# Patient Record
Sex: Female | Born: 1990 | Race: Black or African American | Hispanic: No | Marital: Single | Smoking: Never smoker
Health system: Southern US, Community
[De-identification: ages and names within clinical notes are randomized; demographics above are authoritative.]

## PROBLEM LIST (undated history)

## (undated) DIAGNOSIS — L700 Acne vulgaris: Secondary | ICD-10-CM

## (undated) DIAGNOSIS — L309 Dermatitis, unspecified: Secondary | ICD-10-CM

## (undated) DIAGNOSIS — A749 Chlamydial infection, unspecified: Secondary | ICD-10-CM

## (undated) DIAGNOSIS — O24419 Gestational diabetes mellitus in pregnancy, unspecified control: Secondary | ICD-10-CM

## (undated) HISTORY — PX: NO PAST SURGERIES: SHX2092

## (undated) HISTORY — PX: OTHER SURGICAL HISTORY: SHX169

## (undated) HISTORY — DX: Dermatitis, unspecified: L30.9

## (undated) HISTORY — DX: Acne vulgaris: L70.0

## (undated) HISTORY — DX: Chlamydial infection, unspecified: A74.9

## (undated) HISTORY — DX: Gestational diabetes mellitus in pregnancy, unspecified control: O24.419

---

## 2015-10-08 ENCOUNTER — Telehealth: Payer: Self-pay | Admitting: Family Medicine

## 2015-10-08 NOTE — Telephone Encounter (Signed)
Pt is going to call back when she can make an apt.

## 2015-10-29 ENCOUNTER — Encounter (INDEPENDENT_AMBULATORY_CARE_PROVIDER_SITE_OTHER): Payer: Self-pay

## 2015-10-29 ENCOUNTER — Encounter: Payer: Self-pay | Admitting: Family Medicine

## 2015-10-29 ENCOUNTER — Ambulatory Visit (INDEPENDENT_AMBULATORY_CARE_PROVIDER_SITE_OTHER): Payer: BLUE CROSS/BLUE SHIELD | Admitting: Family Medicine

## 2015-10-29 VITALS — BP 106/62 | HR 76 | Resp 18 | Ht 64.5 in | Wt 131.0 lb

## 2015-10-29 DIAGNOSIS — Z7189 Other specified counseling: Secondary | ICD-10-CM | POA: Diagnosis not present

## 2015-10-29 DIAGNOSIS — L700 Acne vulgaris: Secondary | ICD-10-CM

## 2015-10-29 DIAGNOSIS — Z91048 Other nonmedicinal substance allergy status: Secondary | ICD-10-CM

## 2015-10-29 DIAGNOSIS — Z7689 Persons encountering health services in other specified circumstances: Secondary | ICD-10-CM

## 2015-10-29 DIAGNOSIS — Z9109 Other allergy status, other than to drugs and biological substances: Secondary | ICD-10-CM

## 2015-10-29 MED ORDER — CLINDAMYCIN PHOSPHATE 1 % EX GEL: Freq: Two times a day (BID) | CUTANEOUS | 3 refills | Status: DC

## 2015-10-29 NOTE — Progress Notes (Signed)
Chief Complaint  Patient presents with  . Establish Care    no previous pcp    Patient is a healthy 25 year old woman here to establish for care. She has not seen a physician since she left her pediatrician. She lives with her mother, and works full-time. She states that she eats a healthy diet and exercises regularly. She has no major medical problems. She is on no prescription medications. She has never had a complete physical examination and Pap smear. Patient is sexually active. She is advised to come back for this screening. She has a single partner, a boyfriend of 8 years. She's never had a sexually transmitted disease . She is using condoms for birth control successfully. She has seen a dermatologist for acne. She found Dapsone made her skin Worse. She would like a different prescription for acne. Review of systems she revealed that she does have some minor vaginal itching. No pelvic pain or fever. She is advised to try over-the-counter Monistat until she can come back for a physical and pelvic exam She refuses flu shot. This is recommended for her.  Patient Active Problem List   Diagnosis Date Noted  . Environmental allergies 10/29/2015  . Superficial acne vulgaris 10/29/2015   Outpatient Encounter Prescriptions as of 10/29/2015  Medication Sig  . cetirizine (ZYRTEC) 10 MG tablet Take 10 mg by mouth daily.  . clindamycin (CLINDAGEL) 1 % gel Apply topically 2 (two) times daily.   No facility-administered encounter medications on file as of 10/29/2015.    Past Surgical History:  Procedure Laterality Date  . none     Family History  Problem Relation Age of Onset  . Healthy Mother   . Healthy Father   . Hypertension Maternal Grandmother     Review of Systems  Constitutional: Negative for chills, fever and weight loss.  HENT: Negative for congestion and hearing loss.   Eyes: Negative for blurred vision and pain.  Respiratory: Negative for cough and shortness of breath.    Cardiovascular: Negative for chest pain and leg swelling.  Gastrointestinal: Negative for abdominal pain, constipation, diarrhea and heartburn.  Genitourinary: Negative for dysuria and frequency.       Slight vaginal discharge and itching  Musculoskeletal: Negative for falls, joint pain and myalgias.  Neurological: Negative for dizziness, seizures and headaches.  Psychiatric/Behavioral: Negative for depression. The patient is not nervous/anxious and does not have insomnia.    Physical Exam  Constitutional: She is oriented to person, place, and time. She appears well-developed and well-nourished.  HENT:  Head: Normocephalic and atraumatic.  Right Ear: External ear normal.  Mouth/Throat: Oropharynx is clear and moist.  Cerumen L  Eyes: Conjunctivae are normal. Pupils are equal, round, and reactive to light.  Neck: Normal range of motion. No thyromegaly present.  Cardiovascular: Normal rate and regular rhythm.   Pulmonary/Chest: Effort normal and breath sounds normal. She has no rales.  Abdominal: Soft. Bowel sounds are normal.  Musculoskeletal: Normal range of motion. She exhibits no edema.  Lymphadenopathy:    She has no cervical adenopathy.  Neurological: She is alert and oriented to person, place, and time. She has normal reflexes.  Skin: Skin is warm and dry.  Superficial acne  Psychiatric: She has a normal mood and affect. Her behavior is normal. Judgment and thought content normal.   1. Encounter to establish care with new doctor   2. Environmental allergies Prn Zyrtec  3. Superficial acne vulgaris Discussed cleaning and skin care.  Prescribed clindamycin.  Patient Instructions  Use the clindamycin gel twice a day for acne May use moisturizer in addition if drying occurs Call for problems  Get OTC monistat for the itching symptoms  I recommend you get a flu shot  I recommend you schedule an appointment for a full physical and PAP  See me yearly

## 2015-10-29 NOTE — Patient Instructions (Signed)
Use the clindamycin gel twice a day for acne May use moisturizer in addition if drying occurs Call for problems  Get OTC monistat for the itching symptoms  I recommend you get a flu shot  I recommend you schedule an appointment for a full physical and PAP  See me yearly

## 2015-12-11 ENCOUNTER — Other Ambulatory Visit (HOSPITAL_COMMUNITY)
Admission: RE | Admit: 2015-12-11 | Discharge: 2015-12-11 | Disposition: A | Discharge status: Home / Self Care | Payer: BLUE CROSS/BLUE SHIELD | Source: Ambulatory Visit | Attending: Family Medicine | Admitting: Family Medicine

## 2015-12-11 ENCOUNTER — Encounter: Payer: Self-pay | Admitting: Family Medicine

## 2015-12-11 ENCOUNTER — Ambulatory Visit (INDEPENDENT_AMBULATORY_CARE_PROVIDER_SITE_OTHER): Payer: BLUE CROSS/BLUE SHIELD | Admitting: Family Medicine

## 2015-12-11 ENCOUNTER — Ambulatory Visit (HOSPITAL_COMMUNITY): Admission: RE | Admit: 2015-12-11 | Payer: BLUE CROSS/BLUE SHIELD | Source: Ambulatory Visit

## 2015-12-11 VITALS — BP 104/80 | HR 69 | Ht 64.5 in | Wt 130.0 lb

## 2015-12-11 DIAGNOSIS — R1031 Right lower quadrant pain: Secondary | ICD-10-CM

## 2015-12-11 LAB — URINALYSIS, ROUTINE W REFLEX MICROSCOPIC
Bilirubin Urine: NEGATIVE
GLUCOSE, UA: NEGATIVE mg/dL
Ketones, ur: NEGATIVE mg/dL
Nitrite: NEGATIVE
PH: 6 (ref 5.0–8.0)
PROTEIN: NEGATIVE mg/dL
Specific Gravity, Urine: 1.02 (ref 1.005–1.030)

## 2015-12-11 LAB — CBC WITH DIFFERENTIAL/PLATELET
BASOS ABS: 0 10*3/uL (ref 0.0–0.1)
Basophils Relative: 0 %
EOS PCT: 6 %
Eosinophils Absolute: 0.2 10*3/uL (ref 0.0–0.7)
HEMATOCRIT: 38.2 % (ref 36.0–46.0)
Hemoglobin: 12.8 g/dL (ref 12.0–15.0)
LYMPHS ABS: 1.2 10*3/uL (ref 0.7–4.0)
LYMPHS PCT: 29 %
MCH: 28.2 pg (ref 26.0–34.0)
MCHC: 33.5 g/dL (ref 30.0–36.0)
MCV: 84.1 fL (ref 78.0–100.0)
MONO ABS: 0.3 10*3/uL (ref 0.1–1.0)
Monocytes Relative: 8 %
NEUTROS ABS: 2.4 10*3/uL (ref 1.7–7.7)
Neutrophils Relative %: 57 %
PLATELETS: 289 10*3/uL (ref 150–400)
RBC: 4.54 MIL/uL (ref 3.87–5.11)
RDW: 13.1 % (ref 11.5–15.5)
WBC: 4.2 10*3/uL (ref 4.0–10.5)

## 2015-12-11 LAB — URINE MICROSCOPIC-ADD ON

## 2015-12-11 NOTE — Progress Notes (Signed)
Chief Complaint  Patient presents with  . Back Pain    right mid back pain x saturday. Sharp pain. Eased up Monday   Here for an acute visit On Sat developed RLQ pain that steadily worsened through day On Sunday was immobilized with severe pain, decreased appetite and nausea, no vomiting, and chills.  Pain radiated from right lower abomen to right flank area.  No diarrhea or constipation.  Is just finishing menstrual period.  Feels certain sh eis not pregnant.  No urinary symptoms or frequency or hematuria. No history of ovarian cyst or problem No history of kidney stone or infection No abdominal surgeries On Monday the pain eased up enough to go to work.  Is taking ibuprofen.  Still no appetite.  No fever yesterday or today.      Patient Active Problem List   Diagnosis Date Noted  . Environmental allergies 10/29/2015  . Superficial acne vulgaris 10/29/2015    Outpatient Encounter Prescriptions as of 12/11/2015  Medication Sig  . cetirizine (ZYRTEC) 10 MG tablet Take 10 mg by mouth daily.  . clindamycin (CLINDAGEL) 1 % gel Apply topically 2 (two) times daily.   No facility-administered encounter medications on file as of 12/11/2015.     No Known Allergies  Review of Systems  Constitutional: Positive for activity change, appetite change and chills. Negative for fever.       Didn't take temp  HENT: Negative for congestion and sore throat.   Eyes: Positive for redness. Negative for visual disturbance.       Itching R eye today  Respiratory: Negative for cough and shortness of breath.   Cardiovascular: Negative for chest pain, palpitations and leg swelling.  Gastrointestinal: Positive for abdominal pain and nausea. Negative for abdominal distention, constipation, diarrhea and vomiting.  Genitourinary: Positive for flank pain. Negative for difficulty urinating, dysuria, frequency and menstrual problem.  Musculoskeletal: Negative for arthralgias and myalgias.  Neurological:  Negative.  Negative for dizziness and headaches.  Hematological: Negative for adenopathy. Does not bruise/bleed easily.  Psychiatric/Behavioral: Negative.  Negative for dysphoric mood. The patient is not nervous/anxious.     BP 104/80   Pulse 69   Ht 5' 4.5" (1.638 m)   Wt 130 lb (59 kg)   SpO2 100%   BMI 21.97 kg/m   Physical Exam  Constitutional: She is oriented to person, place, and time. She appears well-developed and well-nourished. No distress.  Appears comfortable  HENT:  Head: Normocephalic and atraumatic.  Nose: Nose normal.  Mouth/Throat: Oropharynx is clear and moist.  Cerumen canals  Eyes: EOM are normal. Pupils are equal, round, and reactive to light.  Right conjunctiva injected  Neck: Normal range of motion. No thyromegaly present.  Cardiovascular: Normal rate, regular rhythm and normal heart sounds.   Pulmonary/Chest: Effort normal and breath sounds normal. No respiratory distress.  Abdominal: Soft. Bowel sounds are normal. There is no hepatosplenomegaly. There is tenderness in the right lower quadrant. There is rebound and guarding.    Musculoskeletal: Normal range of motion. She exhibits no edema.  Mild-mod CVAT to percussion on R  Lymphadenopathy:    She has no cervical adenopathy.  Neurological: She is alert and oriented to person, place, and time.  Skin: Skin is warm and dry. No rash noted.  Psychiatric: She has a normal mood and affect. Her behavior is normal. Thought content normal.    ASSESSMENT/PLAN:  1. Acute abdominal pain in right lower quadrant   - CBC with Differential - Urinalysis,  Routine w reflex microscopic - CT Abdomen Pelvis Wo Contrast; Future   Patient Instructions  Labs and testing today Be sure we can reach you with result    Eustace MooreYvonne Sue Constantina Laseter, MD

## 2015-12-11 NOTE — Patient Instructions (Signed)
Labs and testing today Be sure we can reach you with result

## 2016-04-09 ENCOUNTER — Ambulatory Visit (INDEPENDENT_AMBULATORY_CARE_PROVIDER_SITE_OTHER): Payer: BLUE CROSS/BLUE SHIELD | Admitting: Family Medicine

## 2016-04-09 ENCOUNTER — Encounter: Payer: Self-pay | Admitting: Family Medicine

## 2016-04-09 VITALS — BP 104/60 | HR 64 | Temp 97.9°F | Resp 16 | Ht 64.5 in | Wt 136.1 lb

## 2016-04-09 DIAGNOSIS — L309 Dermatitis, unspecified: Secondary | ICD-10-CM | POA: Diagnosis not present

## 2016-04-09 DIAGNOSIS — L700 Acne vulgaris: Secondary | ICD-10-CM | POA: Diagnosis not present

## 2016-04-09 MED ORDER — HYDROCORTISONE VALERATE 0.2 % EX CREA: 1.0000 "application " | TOPICAL_CREAM | Freq: Two times a day (BID) | CUTANEOUS | 0 refills | Status: DC

## 2016-04-09 MED ORDER — MINOCYCLINE HCL 50 MG PO CAPS: 50.0000 mg | ORAL_CAPSULE | Freq: Two times a day (BID) | ORAL | 3 refills | Status: DC

## 2016-04-09 NOTE — Patient Instructions (Signed)
Use the cortisone cream twice a day  This if for the current face rash Take a antihistamine like benadryl at bedtime Let me know if rash does not improve by Monday  Try minocin for the general acne Give it a month to work Call if you need help with a referral

## 2016-04-09 NOTE — Progress Notes (Signed)
    Chief Complaint  Patient presents with  . Rash    face x 24 hours   Has rash on face for 2 d Itches Sudden onset No new food or med or supplement No new lotion or make up Only other thing she can think of is an insect bite on her right leg 2 d prior No shortness of breath or mouth lesions or trouble swallowing Never had similar rash prior  Patient Active Problem List   Diagnosis Date Noted  . Environmental allergies 10/29/2015  . Superficial acne vulgaris 10/29/2015    Outpatient Encounter Prescriptions as of 04/09/2016  Medication Sig  . cetirizine (ZYRTEC) 10 MG tablet Take 10 mg by mouth daily.  . hydrocortisone valerate cream (WESTCORT) 0.2 % Apply 1 application topically 2 (two) times daily.  . minocycline (MINOCIN) 50 MG capsule Take 1 capsule (50 mg total) by mouth 2 (two) times daily.  . [DISCONTINUED] clindamycin (CLINDAGEL) 1 % gel Apply topically 2 (two) times daily.   No facility-administered encounter medications on file as of 04/09/2016.     No Known Allergies  Review of Systems  Constitutional: Negative for activity change, appetite change and fever.  HENT: Negative.  Negative for congestion, trouble swallowing and voice change.   Eyes: Negative for redness and visual disturbance.  Respiratory: Negative for shortness of breath and wheezing.   Gastrointestinal: Negative for nausea and vomiting.  Skin: Positive for rash.  All other systems reviewed and are negative.   BP 104/60 (BP Location: Right Arm, Patient Position: Sitting, Cuff Size: Normal)   Pulse 64   Temp 97.9 F (36.6 C) (Temporal)   Resp 16   Ht 5' 4.5" (1.638 m)   Wt 136 lb 1.3 oz (61.7 kg)   LMP 04/05/2016 (Exact Date)   SpO2 100%   BMI 23.00 kg/m   Physical Exam  Constitutional: She is oriented to person, place, and time. She appears well-developed and well-nourished. No distress.  HENT:  Head: Normocephalic and atraumatic.  Right Ear: External ear normal.  Left Ear: External  ear normal.  Nose: Nose normal.  Mouth/Throat: Oropharynx is clear and moist.  Eyes: Conjunctivae are normal. Pupils are equal, round, and reactive to light.  Neck: Normal range of motion.  Cardiovascular: Normal rate, regular rhythm and normal heart sounds.   Pulmonary/Chest: Effort normal and breath sounds normal. She has no wheezes.  Musculoskeletal:  Left lateral lower leg with 1 cm hyperpigmented nodule, mobile  Lymphadenopathy:    She has no cervical adenopathy.  Neurological: She is alert and oriented to person, place, and time.  Skin:  Face with fine petechial macular rash, no vesicles or pustules, not palpable cheeks and forehead predominately  Psychiatric: She has a normal mood and affect. Her behavior is normal. Thought content normal.    ASSESSMENT/PLAN:  1. Dermatitis Possible allergic rection  2. Superficial acne vulgaris Ongoing.  Not happy with benzoyl peroxide, salicylic acid, dapsone or clindamycin topicals   Patient Instructions  Use the cortisone cream twice a day  This if for the current face rash Take a antihistamine like benadryl at bedtime Let me know if rash does not improve by Monday  Try minocin for the general acne Give it a month to work Call if you need help with a referral   Miranda MooreYvonne Sue Taylon Coole, MD

## 2016-09-07 ENCOUNTER — Other Ambulatory Visit: Payer: Self-pay | Admitting: Family Medicine

## 2017-01-13 ENCOUNTER — Other Ambulatory Visit: Payer: Self-pay

## 2017-01-13 ENCOUNTER — Ambulatory Visit (INDEPENDENT_AMBULATORY_CARE_PROVIDER_SITE_OTHER): Payer: 59 | Admitting: Family Medicine

## 2017-01-13 ENCOUNTER — Encounter: Payer: Self-pay | Admitting: Family Medicine

## 2017-01-13 VITALS — BP 114/60 | HR 104 | Temp 98.0°F | Resp 16 | Ht 64.0 in | Wt 134.0 lb

## 2017-01-13 DIAGNOSIS — J020 Streptococcal pharyngitis: Secondary | ICD-10-CM | POA: Diagnosis not present

## 2017-01-13 LAB — POCT RAPID STREP A (OFFICE): Rapid Strep A Screen: POSITIVE — AB

## 2017-01-13 MED ORDER — AMOXICILLIN 500 MG PO CAPS: 500.0000 mg | ORAL_CAPSULE | Freq: Two times a day (BID) | ORAL | 0 refills | Status: DC

## 2017-01-13 NOTE — Progress Notes (Signed)
Patient ID: Miranda Potts, female    DOB: 02/05/1991, 26 y.o.   MRN: [id number]  Chief Complaint  Patient presents with  . Sore Throat  . Generalized Body Aches    Allergies Patient has no known allergies.  Subjective:   Miranda Potts is a 26 y.o. female who [facility].  HPI Patient reports that she has had a sore throat for the past 1-2 days.  Reports that she has not been feeling well at all and has had chills, headache, and subjective fever.  Has not had a runny nose or cough.  Reports that her body is achy and sore.  Felt nauseated this morning and threw up.  Reports that her throat has been sore and has taken some Motrin for it.  Denies any shortness of breath or rashes.  No neck stiffness.  No ear pain.  He is not on any regular medications.  Has no known drug allergies.  Did not receive a flu shot.  Does not smoke cigarettes.  Reports that she left work today because she felt so bad.  Is sexually active with her boyfriend.  Reports that she finished her menses yesterday.  Reports she is urinating well.  Has not felt like eating much but is drinking fluids without difficulty.  Voice is normal.  Is able to swallow normal but slightly painful.    Past Medical History:  Diagnosis Date  . Superficial acne vulgaris     Past Surgical History:  Procedure Laterality Date  . none      Family History  Problem Relation Age of Onset  . Healthy Mother   . Healthy Father   . Hypertension Maternal Grandmother      Social History   Socioeconomic History  . Marital status: Single    Spouse name: Not on file  . Number of children: Not on file  . Years of education: Not on file  . Highest education level: Not on file  Social Needs  . Financial resource strain: Not on file  . Food insecurity - worry: Not on file  . Food insecurity - inability: Not on file  . Transportation needs - medical: Not on file  . Transportation needs -  non-medical: Not on file  Occupational History  . Not on file  Tobacco Use  . Smoking status: Never Smoker  . Smokeless tobacco: Never Used  Substance and Sexual Activity  . Alcohol use: Yes    Comment: socially only   . Drug use: No  . Sexual activity: Yes    Partners: Male    Birth control/protection: Condom  Other Topics Concern  . Not on file  Social History Narrative  . Not on file    Review of Systems  Constitutional: Positive for appetite change, chills, fatigue and fever. Negative for unexpected weight change.  HENT: Positive for sore throat. Negative for ear discharge, sinus pressure, sinus pain, trouble swallowing and voice change.   Respiratory: Negative for cough, shortness of breath, wheezing and stridor.   Cardiovascular: Negative for chest pain.  Gastrointestinal: Positive for nausea. Negative for abdominal pain and diarrhea.  Genitourinary: Negative for difficulty urinating, frequency, hematuria, menstrual problem, pelvic pain and urgency.     Objective:   BP 114/60 (BP Location: Left Arm, Patient Position: Sitting, Cuff Size: Normal)   Pulse (!) 104   Temp 98 F (36.7 C)   Resp 16   Ht 5\' 4"  (1.626 m)   Wt 134 lb (  60.8 kg)   SpO2 96%   BMI 23.00 kg/m   Physical Exam  Constitutional: She is oriented to person, place, and time. She appears well-developed and well-nourished.  HENT:  Head: Normocephalic and atraumatic.  Right Ear: Hearing normal.  Left Ear: Hearing normal.  Mouth/Throat: Uvula is midline and mucous membranes are normal. No oral lesions. No uvula swelling. Posterior oropharyngeal erythema present. No oropharyngeal exudate or posterior oropharyngeal edema. Tonsils are 1+ on the right. Tonsils are 1+ on the left. No tonsillar exudate.  Eyes: EOM are normal. Pupils are equal, round, and reactive to light.  Neck: Normal range of motion. Neck supple. No thyromegaly present.  Cardiovascular: Normal rate and regular rhythm.  Pulmonary/Chest:  Effort normal and breath sounds normal. No stridor. No respiratory distress. She has no wheezes. She has no rhonchi. She has no rales. She exhibits no tenderness.  Lymphadenopathy:    She has no cervical adenopathy.  Neurological: She is alert and oriented to person, place, and time.  Skin: Skin is warm and dry.  Psychiatric: She has a normal mood and affect. Her behavior is normal.  Vitals reviewed.  Ear canals obstructed bilaterally with cerumen.  Assessment and Plan   1. Strep pharyngitis Rapid strep test performed and positive. - POCT rapid strep A - amoxicillin (AMOXIL) 500 MG capsule; Take 1 capsule (500 mg total) 2 (two) times daily by mouth.  Dispense: 20 capsule; Refill: 0 Ibuprofen 200 mg over-the-counter as directed. Supportive care and fluids encouraged.  Patient was encouraged to follow-up with her primary care physician for her Pap, pelvic and breast exam and to discuss contraception.  She was also instructed to follow-up for her flu shot after resolution of strep pharyngitis.  Patient was told to call with any questions concerns or worrisome symptoms. She was given a note for work.   No Follow-up on file. Aliene Beamsachel Daneya Hartgrove, MD 01/13/2017

## 2017-04-02 ENCOUNTER — Encounter: Payer: Self-pay | Admitting: Family Medicine

## 2017-04-02 ENCOUNTER — Other Ambulatory Visit: Payer: Self-pay

## 2017-04-02 ENCOUNTER — Ambulatory Visit (INDEPENDENT_AMBULATORY_CARE_PROVIDER_SITE_OTHER): Payer: 59 | Admitting: Family Medicine

## 2017-04-02 VITALS — BP 110/70 | HR 62 | Temp 98.3°F | Resp 16 | Ht 64.0 in | Wt 134.2 lb

## 2017-04-02 DIAGNOSIS — L308 Other specified dermatitis: Secondary | ICD-10-CM | POA: Diagnosis not present

## 2017-04-02 DIAGNOSIS — H6123 Impacted cerumen, bilateral: Secondary | ICD-10-CM | POA: Diagnosis not present

## 2017-04-02 MED ORDER — TRIAMCINOLONE ACETONIDE 0.5 % EX OINT: 1.0000 "application " | TOPICAL_OINTMENT | Freq: Two times a day (BID) | CUTANEOUS | 1 refills | Status: DC

## 2017-04-02 NOTE — Patient Instructions (Signed)
Use the triamcinolone twice a day until rash clears Use plenty of lotion  Consider getting ear wax removal drops at the pharmacy ( [facility] makes one)

## 2017-04-02 NOTE — Progress Notes (Signed)
Chief Complaint  Patient presents with  . Numbness    numbe for about a week, then started peeling  . Cerumen Impaction   Here with 2 complaints today. First, an interesting story where her index and long fingers of the right hand, just the pads, were numb for several days.  She states she had decreased sensation, no prickly or tingling, no pain.  She had had no accident or injury, no change in her activity.  This was followed by peeling of these fingers, followed by both hands.  She is here today with a peeling rash on her hands wondering how to manage this.  No fever or chills.  No other illness.  No new medicines or supplements.  No soap or chemical exposure. She also has a history of excessive earwax.  When she last came to the physician she was told she had cerumen in both ears.  She is noticed a decreased hearing in 1 year.  She would like to have this flushed.  We discussed home management with earwax removal drops.   Patient Active Problem List   Diagnosis Date Noted  . Environmental allergies 10/29/2015  . Superficial acne vulgaris 10/29/2015    Outpatient Encounter Medications as of 04/02/2017  Medication Sig  . cetirizine (ZYRTEC) 10 MG tablet Take 10 mg by mouth daily.  Marland Kitchen. triamcinolone ointment (KENALOG) 0.5 % Apply 1 application topically 2 (two) times daily.   No facility-administered encounter medications on file as of 04/02/2017.     No Known Allergies  Review of Systems  Constitutional: Negative for activity change, appetite change and unexpected weight change.  HENT: Positive for hearing loss. Negative for congestion, dental problem, postnasal drip and rhinorrhea.   Eyes: Negative for redness and visual disturbance.  Respiratory: Negative for cough and shortness of breath.   Cardiovascular: Negative for chest pain, palpitations and leg swelling.  Gastrointestinal: Negative for abdominal pain, constipation and diarrhea.  Genitourinary: Negative for difficulty  urinating, frequency and menstrual problem.  Musculoskeletal: Negative for arthralgias and back pain.  Skin: Positive for rash.  Neurological: Negative for dizziness and headaches.  Psychiatric/Behavioral: Negative for dysphoric mood and sleep disturbance. The patient is not nervous/anxious.     BP 110/70 (BP Location: Left Arm, Patient Position: Sitting, Cuff Size: Normal)   Pulse 62   Temp 98.3 F (36.8 C) (Temporal)   Resp 16   Ht 5\' 4"  (1.626 m)   Wt 134 lb 4 oz (60.9 kg)   LMP 04/02/2017   BMI 23.04 kg/m   Physical Exam  Constitutional: She appears well-developed and well-nourished. No distress.  HENT:  Head: Normocephalic and atraumatic.  Nose: Nose normal.  Mouth/Throat: Oropharynx is clear and moist.  Both ear canals with soft brown cerumen, TMs occluded  Eyes: Conjunctivae are normal. Pupils are equal, round, and reactive to light.  Neck: Normal range of motion.  Cardiovascular: Normal rate, regular rhythm and normal heart sounds.  Pulmonary/Chest: Effort normal and breath sounds normal.  Lymphadenopathy:    She has no cervical adenopathy.  Skin:  Hands with superficial peeling on the palmar and lateral sides, none on the dorsum.  No open skin.  No fissuring.  No vesicles.  No erythema.  No tenderness.  Phalen's and Tinel's are negative.  Sensory exam is normal.    ASSESSMENT/PLAN:  1. Bilateral impacted cerumen - Ear Lavage  2. Other eczema     Patient Instructions  Use the triamcinolone twice a day until rash clears Use  plenty of lotion  Consider getting ear wax removal drops at the pharmacy ( [facility] makes one)   Eustace Moore, MD

## 2017-08-06 ENCOUNTER — Encounter: Payer: Self-pay | Admitting: Family Medicine

## 2017-11-26 ENCOUNTER — Emergency Department (HOSPITAL_COMMUNITY)
Admission: EM | Admit: 2017-11-26 | Discharge: 2017-11-26 | Disposition: A | Discharge status: Home / Self Care | Payer: 59 | Attending: Emergency Medicine | Admitting: Emergency Medicine

## 2017-11-26 ENCOUNTER — Other Ambulatory Visit: Payer: Self-pay

## 2017-11-26 ENCOUNTER — Emergency Department (HOSPITAL_COMMUNITY): Payer: 59

## 2017-11-26 ENCOUNTER — Encounter (HOSPITAL_COMMUNITY): Payer: Self-pay | Admitting: Emergency Medicine

## 2017-11-26 DIAGNOSIS — W230XXA Caught, crushed, jammed, or pinched between moving objects, initial encounter: Secondary | ICD-10-CM | POA: Insufficient documentation

## 2017-11-26 DIAGNOSIS — Z79899 Other long term (current) drug therapy: Secondary | ICD-10-CM | POA: Insufficient documentation

## 2017-11-26 DIAGNOSIS — S5701XA Crushing injury of right elbow, initial encounter: Secondary | ICD-10-CM | POA: Diagnosis present

## 2017-11-26 DIAGNOSIS — Y939 Activity, unspecified: Secondary | ICD-10-CM | POA: Insufficient documentation

## 2017-11-26 DIAGNOSIS — Y929 Unspecified place or not applicable: Secondary | ICD-10-CM | POA: Diagnosis not present

## 2017-11-26 DIAGNOSIS — S5001XA Contusion of right elbow, initial encounter: Secondary | ICD-10-CM | POA: Diagnosis not present

## 2017-11-26 DIAGNOSIS — Y999 Unspecified external cause status: Secondary | ICD-10-CM | POA: Insufficient documentation

## 2017-11-26 MED ORDER — HYDROCODONE-ACETAMINOPHEN 5-325 MG PO TABS
1.0000 | ORAL_TABLET | Freq: Once | ORAL | Status: AC
  Administered 2017-11-26: 1 via ORAL
  Filled 2017-11-26: qty 1

## 2017-11-26 MED ORDER — ONDANSETRON HCL 4 MG PO TABS
4.0000 mg | ORAL_TABLET | Freq: Once | ORAL | Status: AC
  Administered 2017-11-26: 4 mg via ORAL
  Filled 2017-11-26: qty 1

## 2017-11-26 MED ORDER — IBUPROFEN 600 MG PO TABS: 600.0000 mg | ORAL_TABLET | Freq: Four times a day (QID) | ORAL | 0 refills | Status: DC

## 2017-11-26 MED ORDER — IBUPROFEN 400 MG PO TABS
400.0000 mg | ORAL_TABLET | Freq: Once | ORAL | Status: AC
  Administered 2017-11-26: 400 mg via ORAL
  Filled 2017-11-26: qty 1

## 2017-11-26 NOTE — ED Triage Notes (Signed)
Tripped and fell on rt arm last night.  Pain to elbow area

## 2017-11-26 NOTE — ED Provider Notes (Signed)
[facility] EMERGENCY DEPARTMENT Provider Note   CSN: [id number] Arrival date & time: 11/26/17  1729     History   Chief Complaint Chief Complaint  Patient presents with  . Arm Injury    HPI [place] Charbonnet is a 27 y.o. female.  Patient is a 27 year old female who presents to the emergency department with a complaint of right arm pain.  The patient states that on last evening a air conditioner fell on her right elbow area.  She denies any shoulder problem, or wrist problem.  Patient has pain of the right elbow and a portion of the right forearm.  There is pain with attempting to use straighten the elbow and with certain movements of the elbow.  The patient denies being on any anticoagulation medications.  There is been no recent operations or procedures involving the elbow.  There are no other injuries reported at this time.  The history is provided by the patient.  Arm Injury      Past Medical History:  Diagnosis Date  . Superficial acne vulgaris     Patient Active Problem List   Diagnosis Date Noted  . Environmental allergies 10/29/2015  . Superficial acne vulgaris 10/29/2015    Past Surgical History:  Procedure Laterality Date  . none       OB History   None      Home Medications    Prior to Admission medications   Medication Sig Start Date End Date Taking? Authorizing Provider  cetirizine (ZYRTEC) 10 MG tablet Take 10 mg by mouth daily.    [provider]  triamcinolone ointment (KENALOG) 0.5 % Apply 1 application topically 2 (two) times daily. 04/02/17   Eustace Moore, MD    Family History Family History  Problem Relation Age of Onset  . Healthy Mother   . Healthy Father   . Hypertension Maternal Grandmother     Social History Social History   Tobacco Use  . Smoking status: Never Smoker  . Smokeless tobacco: Never Used  Substance Use Topics  . Alcohol use: Yes    Comment: socially only   . Drug use: No     Allergies     Patient has no known allergies.   Review of Systems Review of Systems  Constitutional: Negative for activity change.       All ROS Neg except as noted in HPI  HENT: Negative for nosebleeds.   Eyes: Negative for photophobia and discharge.  Respiratory: Negative for cough, shortness of breath and wheezing.   Cardiovascular: Negative for chest pain and palpitations.  Gastrointestinal: Negative for abdominal pain and blood in stool.  Genitourinary: Negative for dysuria, frequency and hematuria.  Musculoskeletal: Positive for arthralgias. Negative for back pain and neck pain.  Skin: Negative.   Neurological: Negative for dizziness, seizures and speech difficulty.  Psychiatric/Behavioral: Negative for confusion and hallucinations.     Physical Exam Updated Vital Signs BP 112/74 (BP Location: Left Arm)   Pulse 76   Temp 98.3 F (36.8 C) (Oral)   Resp 18   Ht 5\' 4"  (1.626 m)   Wt 59 kg   LMP 09/30/2017 Comment: irregular periods   SpO2 99%   BMI 22.31 kg/m   Physical Exam  Constitutional: She is oriented to person, place, and time. She appears well-developed and well-nourished.  Non-toxic appearance.  HENT:  Head: Normocephalic.  Right Ear: Tympanic membrane and external ear normal.  Left Ear: Tympanic membrane and external ear normal.  Eyes: Pupils are  equal, round, and reactive to light. EOM and lids are normal.  Neck: Normal range of motion. Neck supple. Carotid bruit is not present.  Cardiovascular: Normal rate, regular rhythm, normal heart sounds, intact distal pulses and normal pulses.  Pulmonary/Chest: Breath sounds normal. No respiratory distress.  Abdominal: Soft. Bowel sounds are normal. There is no tenderness. There is no guarding.  Musculoskeletal: Normal range of motion.  There is full range of motion noted of the right shoulder.  There is no deformity of the bicep tricep area or the humerus.  There is pain olecranon process.  There is pain of the medial and  lateral epicondyles. Radial pulses 2+ on the right.  The capillary refill is less than 2 seconds.  Lymphadenopathy:       Head (right side): No submandibular adenopathy present.       Head (left side): No submandibular adenopathy present.    She has no cervical adenopathy.  Neurological: She is alert and oriented to person, place, and time. She has normal strength. No cranial nerve deficit or sensory deficit.  Skin: Skin is warm and dry.  Psychiatric: She has a normal mood and affect. Her speech is normal.  Nursing note and vitals reviewed.    ED Treatments / Results  Labs (all labs ordered are listed, but only abnormal results are displayed) Labs Reviewed - No data to display  EKG None  Radiology No results found.  Procedures Procedures (including critical care time)  Medications Ordered in ED Medications - No data to display   Initial Impression / Assessment and Plan / ED Course  I have reviewed the triage vital signs and the nursing notes.  Pertinent labs & imaging results that were available during my care of the patient were reviewed by me and considered in my medical decision making (see chart for details).       Final Clinical Impressions(s) / ED Diagnoses MDM  Vital signs reviewed.  Pulse oximetry is 99% on room air.  Within normal limits by my interpretation.  No gross neurovascular deficit noted of the right upper extremity.  There is pain of the olecranon and the lateral epicondyle area.  X-ray pending.  Patient states she continues to have pain.  Patient treated in the emergency department with ibuprofen and Norco.  Pain improving after medications in the emergency department.  X-ray is negative for fracture, dislocation, or effusion.  I discussed the findings of the examination as well as the findings on the x-ray with the patient in terms of which she understands.  The patient will use the sling over the next 3 or 4 days.  The patient will use  ibuprofen with breakfast, lunch, dinner, and at bedtime.  Patient to follow-up with Dr. Romeo Apple for additional orthopedic evaluation if not improving.   Final diagnoses:  Contusion of right elbow, initial encounter    ED Discharge Orders         Ordered    ibuprofen (ADVIL,MOTRIN) 600 MG tablet  4 times daily     11/26/17 1938           Ivery Quale, PA-C 11/26/17 1948    Jacalyn Lefevre, MD 11/26/17 2237

## 2017-11-26 NOTE — Discharge Instructions (Signed)
Your vital signs are within normal limits.  Your oxygen level is 99% on room air.  Within normal limits by my interpretation.  There are no neurological vascular deficits appreciated of your right upper extremity.  The x-ray of your elbow area is negative for fracture, dislocation, effusion/fluid in the joint.  Please use ibuprofen with breakfast, lunch, dinner, and at bedtime.  Please see Dr. Romeo Apple for additional evaluation patient if not improving.

## 2017-12-21 ENCOUNTER — Other Ambulatory Visit: Payer: Self-pay | Admitting: Family Medicine

## 2018-01-31 ENCOUNTER — Emergency Department (HOSPITAL_COMMUNITY): Payer: 59

## 2018-01-31 ENCOUNTER — Other Ambulatory Visit: Payer: Self-pay

## 2018-01-31 ENCOUNTER — Encounter (HOSPITAL_COMMUNITY): Payer: Self-pay | Admitting: *Deleted

## 2018-01-31 ENCOUNTER — Emergency Department (HOSPITAL_COMMUNITY)
Admission: EM | Admit: 2018-01-31 | Discharge: 2018-01-31 | Disposition: A | Discharge status: Home / Self Care | Payer: 59 | Attending: Emergency Medicine | Admitting: Emergency Medicine

## 2018-01-31 DIAGNOSIS — S61412A Laceration without foreign body of left hand, initial encounter: Secondary | ICD-10-CM | POA: Insufficient documentation

## 2018-01-31 DIAGNOSIS — W25XXXA Contact with sharp glass, initial encounter: Secondary | ICD-10-CM | POA: Diagnosis not present

## 2018-01-31 DIAGNOSIS — Y9302 Activity, running: Secondary | ICD-10-CM | POA: Insufficient documentation

## 2018-01-31 DIAGNOSIS — S71111A Laceration without foreign body, right thigh, initial encounter: Secondary | ICD-10-CM

## 2018-01-31 DIAGNOSIS — S61511A Laceration without foreign body of right wrist, initial encounter: Secondary | ICD-10-CM | POA: Diagnosis not present

## 2018-01-31 DIAGNOSIS — Y999 Unspecified external cause status: Secondary | ICD-10-CM | POA: Insufficient documentation

## 2018-01-31 DIAGNOSIS — Z23 Encounter for immunization: Secondary | ICD-10-CM | POA: Insufficient documentation

## 2018-01-31 DIAGNOSIS — Y929 Unspecified place or not applicable: Secondary | ICD-10-CM | POA: Diagnosis not present

## 2018-01-31 MED ORDER — NAPROXEN 500 MG PO TABS: 500.0000 mg | ORAL_TABLET | Freq: Two times a day (BID) | ORAL | 0 refills | Status: DC

## 2018-01-31 MED ORDER — TETANUS-DIPHTH-ACELL PERTUSSIS 5-2.5-18.5 LF-MCG/0.5 IM SUSP
0.5000 mL | Freq: Once | INTRAMUSCULAR | Status: AC
  Administered 2018-01-31: 0.5 mL via INTRAMUSCULAR
  Filled 2018-01-31: qty 0.5

## 2018-01-31 MED ORDER — LIDOCAINE-EPINEPHRINE (PF) 2 %-1:200000 IJ SOLN
10.0000 mL | Freq: Once | INTRAMUSCULAR | Status: AC
  Administered 2018-01-31: 10 mL via INTRADERMAL
  Filled 2018-01-31: qty 10

## 2018-01-31 MED ORDER — BACITRACIN ZINC 500 UNIT/GM EX OINT
1.0000 "application " | TOPICAL_OINTMENT | Freq: Two times a day (BID) | CUTANEOUS | Status: DC
  Administered 2018-01-31: 1 via TOPICAL
  Filled 2018-01-31: qty 0.9

## 2018-01-31 NOTE — ED Notes (Signed)
ED Provider at bedside. 

## 2018-01-31 NOTE — ED Triage Notes (Signed)
Patient ran through a glass door running from a dog.  Patient called 911 was treated, refused transport. Patient arrived to ED by private car.  Patient stated dizziness.  Bleeding controlled by wrapped gauze to bilateral hands and right thigh.

## 2018-01-31 NOTE — Discharge Instructions (Signed)

## 2018-01-31 NOTE — ED Provider Notes (Signed)
[facility] EMERGENCY DEPARTMENT Provider Note   CSN: [id number] Arrival date & time: 01/31/18  1750     History   Chief Complaint Chief Complaint  Patient presents with  . Laceration    HPI Miranda Potts is a 27 y.o. female.  HPI  The patient is a 27 year old female, she has no chronic medical conditions, she presents to the hospital after being injured when she ran through a glass door trying to escape a pimple that was chasing after her.  She has several lacerations involving her right dorsal wrist, right inner distal thigh and her left palm.  She denies any other injuries including head chest back neck face left lower extremity.  This was acute in onset, occurred 2 hours ago, she has had her childhood vaccinations but is unsure if she is up-to-date on tetanus.  Past Medical History:  Diagnosis Date  . Superficial acne vulgaris     Patient Active Problem List   Diagnosis Date Noted  . Environmental allergies 10/29/2015  . Superficial acne vulgaris 10/29/2015    Past Surgical History:  Procedure Laterality Date  . none       OB History   None      Home Medications    Prior to Admission medications   Medication Sig Start Date End Date Taking? Authorizing Provider  cetirizine (ZYRTEC) 10 MG tablet Take 10 mg by mouth daily.    [provider]  ibuprofen (ADVIL,MOTRIN) 600 MG tablet Take 1 tablet (600 mg total) by mouth 4 (four) times daily. 11/26/17   Ivery Quale, PA-C  naproxen (NAPROSYN) 500 MG tablet Take 1 tablet (500 mg total) by mouth 2 (two) times daily with a meal. 01/31/18   Eber Hong, MD  triamcinolone ointment (KENALOG) 0.5 % Apply 1 application topically 2 (two) times daily. 04/02/17   Eustace Moore, MD    Family History Family History  Problem Relation Age of Onset  . Healthy Mother   . Healthy Father   . Hypertension Maternal Grandmother     Social History Social History   Tobacco Use  . Smoking status: Never Smoker   . Smokeless tobacco: Never Used  Substance Use Topics  . Alcohol use: Yes    Comment: socially only   . Drug use: No     Allergies   Patient has no known allergies.   Review of Systems Review of Systems  Constitutional: Negative for fever.  Skin: Positive for wound.  Neurological: Negative for numbness.     Physical Exam Updated Vital Signs BP 103/62 (BP Location: Right Arm)   Pulse 68   Temp 98.2 F (36.8 C) (Oral)   Ht 1.626 m (5\' 4" )   Wt 59 kg   LMP 01/26/2018   SpO2 100%   BMI 22.31 kg/m   Physical Exam  Constitutional: She appears well-developed and well-nourished. No distress.  HENT:  Head: Normocephalic.  Eyes: Conjunctivae are normal. No scleral icterus.  Cardiovascular: Normal rate and regular rhythm.  Pulmonary/Chest: Effort normal and breath sounds normal.  Musculoskeletal: Normal range of motion. She exhibits tenderness ( ttp over the laceration site). She exhibits no edema.  Laceration noted to the dorsal right wrist, distal medial right thigh and left palm.  Otherwise all joints are very supple, compartments are very soft, no active bleeding, no foreign body seen on examination  Neurological: She is alert. Coordination normal.  Sensation and motor intact  Skin: Skin is warm and dry. She is not diaphoretic.  Lacerations  as noted above     ED Treatments / Results  Labs (all labs ordered are listed, but only abnormal results are displayed) Labs Reviewed - No data to display  EKG None  Radiology Dg Wrist Complete Right  Result Date: 01/31/2018 CLINICAL DATA:  jumped into glass door. Laceration to posterior right wrist. EXAM: RIGHT WRIST - COMPLETE 3+ VIEW COMPARISON:  None. FINDINGS: Posterior soft tissue laceration over the right wrist. No radiopaque foreign bodies. No acute bony abnormality. Specifically, no fracture, subluxation, or dislocation. IMPRESSION: No acute bony abnormality. Electronically Signed   By: Charlett Nose M.D.   On:  01/31/2018 21:28   Dg Femur Min 2 Views Right  Result Date: 01/31/2018 CLINICAL DATA:  Jumped into glass door.  Laceration. EXAM: RIGHT FEMUR 2 VIEWS COMPARISON:  None. FINDINGS: No acute bony abnormality. Specifically, no fracture, subluxation, or dislocation. No radiopaque foreign bodies within the soft tissues. IMPRESSION: No fracture or radiopaque foreign body. Electronically Signed   By: Charlett Nose M.D.   On: 01/31/2018 21:28    Procedures .Marland KitchenLaceration Repair Date/Time: 01/31/2018 10:38 PM Performed by: Eber Hong, MD Authorized by: Eber Hong, MD   Consent:    Consent obtained:  Verbal   Consent given by:  Patient   Risks discussed:  Infection, pain, need for additional repair, poor cosmetic result and poor wound healing   Alternatives discussed:  No treatment and delayed treatment Anesthesia (see MAR for exact dosages):    Anesthesia method:  Local infiltration   Local anesthetic:  Lidocaine 1% WITH epi Laceration details:    Location: Right Wrist - dorsal.   Length (cm):  2   Depth (mm):  2 Repair type:    Repair type:  Simple Pre-procedure details:    Preparation:  Patient was prepped and draped in usual sterile fashion and imaging obtained to evaluate for foreign bodies Exploration:    Hemostasis achieved with:  Direct pressure   Wound exploration: wound explored through full range of motion and entire depth of wound probed and visualized     Wound extent: no fascia violation noted, no foreign bodies/material noted, no muscle damage noted, no nerve damage noted, no tendon damage noted, no underlying fracture noted and no vascular damage noted   Treatment:    Area cleansed with:  Betadine   Amount of cleaning:  Standard   Irrigation solution:  Sterile saline   Irrigation volume:  500   Irrigation method:  Syringe Skin repair:    Repair method:  Sutures   Suture size:  5-0   Suture material:  Prolene   Suture technique:  Simple interrupted   Number of  sutures:  3 Approximation:    Approximation:  Close Post-procedure details:    Dressing:  Antibiotic ointment and sterile dressing   Patient tolerance of procedure:  Tolerated well, no immediate complications Comments:        Marland KitchenMarland KitchenLaceration Repair Date/Time: 01/31/2018 10:39 PM Performed by: Eber Hong, MD Authorized by: Eber Hong, MD   Consent:    Consent obtained:  Verbal   Consent given by:  Patient   Risks discussed:  Infection, pain, need for additional repair, poor cosmetic result and poor wound healing   Alternatives discussed:  No treatment and delayed treatment Anesthesia (see MAR for exact dosages):    Anesthesia method:  Local infiltration   Local anesthetic:  Lidocaine 1% WITH epi Laceration details:    Location:  Leg   Leg location:  R upper leg   Length (  cm):  4.5   Depth (mm):  3 Repair type:    Repair type:  Simple Pre-procedure details:    Preparation:  Patient was prepped and draped in usual sterile fashion and imaging obtained to evaluate for foreign bodies Exploration:    Hemostasis achieved with:  Direct pressure   Wound exploration: wound explored through full range of motion and entire depth of wound probed and visualized     Wound extent: no fascia violation noted, no foreign bodies/material noted, no muscle damage noted, no nerve damage noted, no tendon damage noted, no underlying fracture noted and no vascular damage noted     Contaminated: no   Treatment:    Area cleansed with:  Betadine   Amount of cleaning:  Standard   Irrigation solution:  Sterile saline   Irrigation volume:  500   Irrigation method:  Syringe Skin repair:    Repair method:  Sutures   Suture size:  4-0   Suture material:  Prolene   Suture technique:  Simple interrupted and horizontal mattress   Number of sutures:  6 (1 horizontal mattress to anchor for tension, 5 simple interrupted) Approximation:    Approximation:  Close Post-procedure details:    Dressing:   Antibiotic ointment and sterile dressing   Patient tolerance of procedure:  Tolerated well, no immediate complications Comments:         (including critical care time)  Medications Ordered in ED Medications  bacitracin ointment 1 application (1 application Topical Given 01/31/18 2047)  Tdap (BOOSTRIX) injection 0.5 mL (0.5 mLs Intramuscular Given 01/31/18 2035)  lidocaine-EPINEPHrine (XYLOCAINE W/EPI) 2 %-1:200000 (PF) injection 10 mL (10 mLs Intradermal Given 01/31/18 2045)     Initial Impression / Assessment and Plan / ED Course  I have reviewed the triage vital signs and the nursing notes.  Pertinent labs & imaging results that were available during my care of the patient were reviewed by me and considered in my medical decision making (see chart for details).     The patient had dressings placed on arrival.  She will need imaging to rule out foreign bodies of glass in the wounds but otherwise seems to have very straightforward linear lacerations.  There is no active bleeding, these lacerations or not near blood vessels and she has full extensor and flexor function of her bilateral hands.  The palm on the left has a very small superficial laceration which does not need primary repair.  X-rays negative for foreign bodies, irrigated and explored wounds extensively, no deep tissue damage, extensor mechanism totally intact to the right hand.  There is a very small wound to the left palm, this is not can require stitches, I recommended topical anabiotic's, she had extensive wound cleaning including her palm in the emergency department, stable for discharge on Naprosyn, no antibiotics indicated  Final Clinical Impressions(s) / ED Diagnoses   Final diagnoses:  Laceration of right wrist, initial encounter  Laceration of right thigh, initial encounter    ED Discharge Orders         Ordered    naproxen (NAPROSYN) 500 MG tablet  2 times daily with meals     01/31/18 2236             Eber HongMiller, Maxx Calaway, MD 01/31/18 2240

## 2018-02-10 ENCOUNTER — Emergency Department (HOSPITAL_COMMUNITY)
Admission: EM | Admit: 2018-02-10 | Discharge: 2018-02-10 | Disposition: A | Discharge status: Home / Self Care | Payer: 59 | Attending: Emergency Medicine | Admitting: Emergency Medicine

## 2018-02-10 ENCOUNTER — Other Ambulatory Visit: Payer: Self-pay

## 2018-02-10 ENCOUNTER — Encounter (HOSPITAL_COMMUNITY): Payer: Self-pay | Admitting: Emergency Medicine

## 2018-02-10 DIAGNOSIS — Z4802 Encounter for removal of sutures: Secondary | ICD-10-CM | POA: Diagnosis not present

## 2018-02-10 NOTE — ED Triage Notes (Signed)
Sutures placed last Monday.  Sutures to right wrist and right upper leg.  Denies any pain at this time.

## 2018-02-10 NOTE — ED Provider Notes (Signed)
[facility] EMERGENCY DEPARTMENT Provider Note   CSN: [id number] Arrival date & time: 02/10/18  1624     History   Chief Complaint Chief Complaint  Patient presents with  . Suture / Staple Removal    HPI Miranda Potts is a 27 y.o. female presenting for suture removal of her right wrist and her right thigh, sutures placed 10 days ago here. Injury occurred with accidental broken glass.  She reports no complications from the wounds which appear to be healing well.   HPI  Past Medical History:  Diagnosis Date  . Superficial acne vulgaris     Patient Active Problem List   Diagnosis Date Noted  . Environmental allergies 10/29/2015  . Superficial acne vulgaris 10/29/2015    Past Surgical History:  Procedure Laterality Date  . none       OB History   No obstetric history on file.      Home Medications    Prior to Admission medications   Medication Sig Start Date End Date Taking? Authorizing Provider  cetirizine (ZYRTEC) 10 MG tablet Take 10 mg by mouth daily.    [provider]  ibuprofen (ADVIL,MOTRIN) 600 MG tablet Take 1 tablet (600 mg total) by mouth 4 (four) times daily. 11/26/17   Ivery QualeBryant, [place], PA-C  naproxen (NAPROSYN) 500 MG tablet Take 1 tablet (500 mg total) by mouth 2 (two) times daily with a meal. 01/31/18   Eber HongMiller, Brian, MD  triamcinolone ointment (KENALOG) 0.5 % Apply 1 application topically 2 (two) times daily. 04/02/17   Eustace MooreNelson, Yvonne Sue, MD    Family History Family History  Problem Relation Age of Onset  . Healthy Mother   . Healthy Father   . Hypertension Maternal Grandmother     Social History Social History   Tobacco Use  . Smoking status: Never Smoker  . Smokeless tobacco: Never Used  Substance Use Topics  . Alcohol use: Yes    Comment: socially only   . Drug use: No     Allergies   Patient has no known allergies.   Review of Systems Review of Systems  Constitutional: Negative for chills and fever.  Skin:  Positive for wound. Negative for color change.  Neurological: Negative for numbness.     Physical Exam Updated Vital Signs BP 123/81 (BP Location: Right Arm)   Pulse 61   Temp (!) 97.4 F (36.3 C) (Oral)   Resp 16   Ht 5\' 4"  (1.626 m)   Wt 59 kg   LMP 01/26/2018   SpO2 99%   BMI 22.31 kg/m   Physical Exam Constitutional:      Appearance: She is well-developed.  HENT:     Head: Normocephalic.  Cardiovascular:     Rate and Rhythm: Normal rate.  Pulmonary:     Effort: Pulmonary effort is normal.  Musculoskeletal:        General: No tenderness.  Skin:    Findings: Laceration present.     Comments: Well healed lacerations right dorsal wrist and medial thigh.  Neurological:     Mental Status: She is alert and oriented to person, place, and time.     Sensory: No sensory deficit.      ED Treatments / Results  Labs (all labs ordered are listed, but only abnormal results are displayed) Labs Reviewed - No data to display  EKG None  Radiology No results found.  Procedures Procedures (including critical care time)  SUTURE REMOVAL Performed by: Burgess AmorJulie Oneka Parada  Consent: Verbal consent  obtained. Patient identity confirmed: provided demographic data Time out: Immediately prior to procedure a "time out" was called to verify the correct patient, procedure, equipment, support staff and site/side marked as required.  Location details: right wrist  Wound Appearance: clean  Sutures/Staples Removed: #3  Facility: sutures placed in this facility Patient tolerance: Patient tolerated the procedure well with no immediate complications.  SUTURE REMOVAL Performed by: Burgess Amor  Consent: Verbal consent obtained. Patient identity confirmed: provided demographic data Time out: Immediately prior to procedure a "time out" was called to verify the correct patient, procedure, equipment, support staff and site/side marked as required.  Location details: right thigh  Wound  Appearance: clean  Sutures/Staples Removed: #6  Facility: sutures placed in this facility Patient tolerance: Patient tolerated the procedure well with no immediate complications.        Medications Ordered in ED Medications - No data to display   Initial Impression / Assessment and Plan / ED Course  I have reviewed the triage vital signs and the nursing notes.  Pertinent labs & imaging results that were available during my care of the patient were reviewed by me and considered in my medical decision making (see chart for details).     Prn f/u anticipated  Final Clinical Impressions(s) / ED Diagnoses   Final diagnoses:  Visit for suture removal    ED Discharge Orders    None       Victoriano Lain 02/10/18 1710    Sabas Sous, MD 02/11/18 [phone]

## 2018-02-10 NOTE — Discharge Instructions (Addendum)
You may continue using neosporin on your wounds as they continue to heal. This will keep them soft and supple.  We have placed a sterile strip on the leg laceration to give it a little extra support for the next several days.  This will fall away on its own.

## 2018-05-10 ENCOUNTER — Encounter: Payer: Self-pay | Admitting: Adult Health

## 2018-05-12 ENCOUNTER — Encounter: Payer: Self-pay | Admitting: Adult Health

## 2018-05-13 ENCOUNTER — Encounter: Payer: Self-pay | Admitting: Adult Health

## 2018-09-22 ENCOUNTER — Encounter: Payer: 59 | Admitting: Advanced Practice Midwife

## 2018-10-06 DIAGNOSIS — Z3400 Encounter for supervision of normal first pregnancy, unspecified trimester: Secondary | ICD-10-CM | POA: Diagnosis not present

## 2018-10-09 LAB — OB RESULTS CONSOLE RPR: RPR: NONREACTIVE

## 2018-10-09 LAB — OB RESULTS CONSOLE ANTIBODY SCREEN: Antibody Screen: NEGATIVE

## 2018-10-09 LAB — OB RESULTS CONSOLE RUBELLA ANTIBODY, IGM: Rubella: IMMUNE

## 2018-10-09 LAB — OB RESULTS CONSOLE ABO/RH: RH Type: POSITIVE

## 2018-10-09 LAB — OB RESULTS CONSOLE HEPATITIS B SURFACE ANTIGEN: Hepatitis B Surface Ag: NEGATIVE

## 2018-10-09 LAB — OB RESULTS CONSOLE HIV ANTIBODY (ROUTINE TESTING): HIV: NONREACTIVE

## 2018-12-27 DIAGNOSIS — N898 Other specified noninflammatory disorders of vagina: Secondary | ICD-10-CM | POA: Diagnosis not present

## 2018-12-27 DIAGNOSIS — Z36 Encounter for antenatal screening for chromosomal anomalies: Secondary | ICD-10-CM | POA: Diagnosis not present

## 2018-12-27 DIAGNOSIS — Z3402 Encounter for supervision of normal first pregnancy, second trimester: Secondary | ICD-10-CM | POA: Diagnosis not present

## 2018-12-27 DIAGNOSIS — O26892 Other specified pregnancy related conditions, second trimester: Secondary | ICD-10-CM | POA: Diagnosis not present

## 2019-02-14 DIAGNOSIS — Z3402 Encounter for supervision of normal first pregnancy, second trimester: Secondary | ICD-10-CM | POA: Diagnosis not present

## 2019-02-22 ENCOUNTER — Encounter: Payer: 59 | Attending: Internal Medicine | Admitting: Nutrition

## 2019-02-22 ENCOUNTER — Other Ambulatory Visit: Payer: Self-pay

## 2019-02-22 DIAGNOSIS — O24419 Gestational diabetes mellitus in pregnancy, unspecified control: Secondary | ICD-10-CM | POA: Insufficient documentation

## 2019-02-22 DIAGNOSIS — Z3493 Encounter for supervision of normal pregnancy, unspecified, third trimester: Secondary | ICD-10-CM | POA: Insufficient documentation

## 2019-02-22 NOTE — Progress Notes (Signed)
  Medical Nutrition Therapy:  Appt start time: 1100 end time:  1200.   Assessment:  Primary concerns today: Gestational DM. Lives with her family.  G1P0  [redacted] weeks gestation.  [facility] 05-19-19 Pre pregnancy  wt 140's.   1 hr GTT 158.8 mg/dl.. 2 hr 173 /mg/dl, 3 hr 130 mg/dl. Was taking a B6 and Sleeping pill of unisom for nausea and sleeping.. She thinks those meds made her eat too much. They helped her with her nausea though. Stopped taking unisom 2 days ago. Prenatal vitamin daily. Hasn't been testing blood sugars. She doesn't have a meter. Provided meter and testing instructions.  Had been eating unhealthy foods before first viist and then has been working eating better foods this time. Diet needs more dark leafy greens, water and increased avctivity.  Activity:. Walks some Works for [employer] from home for 3 years. Willing to work on eating better meals and more fresh fruits and vegetables.    Preferred Learning Style:  No preference indicated    Learning Readiness:   Ready  Change in progress   MEDICATIONS:   DIETARY INTAKE:    24-hr recall:  B ( AM): oatmeal with can't believe butter an splenda 1 pkt plain, water Snk ( AM): L ( PM):  Grilled chicken and greens, water Snk ( PM): D ( PM): chicken wings -6, water Snk ( PM): apple  Beverages: water  Usual physical activity: Walks some  Estimated energy needs: 1800-2200  calories 200 g carbohydrates 135 g protein 50 g fat  Progress Towards Goal(s):  In progress.   Nutritional Diagnosis:  NB-1.1 Food and nutrition-related knowledge deficit As related to Gestational DM .  As evidenced by Elevated GTT Levels..    Intervention:   Patient was seen on 02-22-19 for Gestational Diabetes [facility]management [facility] The following learning objectives were met by the patient during this course:   States the definition of Gestational Diabetes  States why dietary  management is important in controlling blood glucose  Describes the effects each nutrient has on blood glucose levels  Demonstrates ability to create a balanced meal plan  Demonstrates carbohydrate counting   States when to check blood glucose levels  Demonstrates proper blood glucose monitoring techniques  States the effect of stress and exercise on blood glucose levels  States the importance of limiting caffeine and abstaining from alcohol and smoking  Blood glucose monitor given: Accucheck Guide  Goals  Patient instructed to monitor glucose levels: FBS: 60 - <90 1 hour: <140 2 hour: <120  Eat meals on time as discussed Drink only water Test blood sugars before breakfast and 2 hours after each meal. Record on sheet and bring at next visit.    *Patient received handouts:  Nutrition Diabetes and Pregnancy  Carbohydrate Counting List  Patient will be seen for follow-up as needed. .  Teaching Method Utilized:  Visual Auditory Hands on  Handouts given during visit include:  Gestational Hanouts   Barriers to learning/adherence to lifestyle change: none  Demonstrated degree of understanding via:  Teach Back   Monitoring/Evaluation:  Dietary intake, exercise,  , and body weight in 1 week(s).

## 2019-03-01 ENCOUNTER — Encounter: Payer: Self-pay | Admitting: Nutrition

## 2019-03-01 ENCOUNTER — Encounter: Payer: 59 | Attending: Internal Medicine | Admitting: Nutrition

## 2019-03-01 ENCOUNTER — Other Ambulatory Visit: Payer: Self-pay

## 2019-03-01 VITALS — Ht 64.0 in | Wt 167.0 lb

## 2019-03-01 DIAGNOSIS — Z3493 Encounter for supervision of normal pregnancy, unspecified, third trimester: Secondary | ICD-10-CM | POA: Diagnosis not present

## 2019-03-01 DIAGNOSIS — O24419 Gestational diabetes mellitus in pregnancy, unspecified control: Secondary | ICD-10-CM | POA: Diagnosis not present

## 2019-03-01 NOTE — Patient Instructions (Signed)
Patient instructed to monitor glucose levels: FBS: 60 - <90 1 hour: <140 2 hour: <120  Eat meals on time as discussed Drink only water Test blood sugars before breakfast and 2 hours after each meal. Record on sheet and bring at next visit.

## 2019-03-01 NOTE — Progress Notes (Signed)
  Medical Nutrition Therapy:  Appt start time: 5809 end time:  1045.   Assessment:  Primary concerns today: Gestational DM. Lives with her family.  G1P0  [redacted] weeks gestation.  [facility] 05-19-19 Pre pregnancy  wt 140's.   FBS 81-93,  2 hours after breakfast, 76, 81,  After lunch 102, 99, after supper, 88, 102. Eating three meals and snacks.  Went to  [facility] [place] yesterday and is working on transferring to [facility] in [place], [place] for her prenatal care since it's closer. Feels good. Baby moving well.   Preferred Learning Style:  No preference indicated    Learning Readiness:   Ready  Change in progress   MEDICATIONS:   DIETARY INTAKE:    24-hr recall:  B ( AM): [place], bacon, toast Snk ( AM): granola bar, L ( PM):   Grilled chicken, broccoli and wheat bread, water  Snk ( PM): carrots,  D ( PM): hamburger with bun, salad, wheat bread, stawberries, water Snk ( PM): apple  Beverages: water  Usual physical activity: Walks some  Estimated energy needs: 1800-2200  calories 200 g carbohydrates 135 g protein 50 g fat  Progress Towards Goal(s):  In progress.   Nutritional Diagnosis:  NB-1.1 Food and nutrition-related knowledge deficit As related to Gestational DM .  As evidenced by Elevated GTT Levels..    Intervention:   Patient was seen on 02-22-19 for Gestational Diabetes self-management class [facility]. The following learning objectives were met by the patient during this course:   States the definition of Gestational Diabetes  States why dietary management is important in controlling blood glucose  Describes the effects each nutrient has on blood glucose levels  Demonstrates ability to create a balanced meal plan  Demonstrates carbohydrate counting   States when to check blood glucose levels  Demonstrates proper blood glucose monitoring techniques  States the effect of stress and exercise on blood glucose  levels  States the importance of limiting caffeine and abstaining from alcohol and smoking  Blood glucose monitor given: Accucheck Guide  Goals Keep up the great job!  Patient instructed to monitor glucose levels: FBS: 60 - <90 1 hour: <140 2 hour: <120  Eat meals on time as discussed Drink only water Test blood sugars before breakfast and 2 hours after each meal. Record on sheet and bring at next visit.    *Patient received handouts:  Nutrition Diabetes and Pregnancy  Carbohydrate Counting List  Patient will be seen for follow-up as needed. .  Teaching Method Utilized:  Visual Auditory Hands on  Handouts given during visit include:  Gestational Hanouts   Barriers to learning/adherence to lifestyle change: none  Demonstrated degree of understanding via:  Teach Back   Monitoring/Evaluation:  Dietary intake, exercise,  , and body weight  PRN.

## 2019-03-01 NOTE — Patient Instructions (Signed)
Goals Keep up the great job!  Patient instructed to monitor glucose levels: FBS: 60 - <90 1 hour: <140 2 hour: <120  Eat meals on time as discussed Drink only water Test blood sugars before breakfast and 2 hours after each meal. Record on sheet and bring at MD  visits.

## 2019-03-15 ENCOUNTER — Other Ambulatory Visit (INDEPENDENT_AMBULATORY_CARE_PROVIDER_SITE_OTHER): Payer: Self-pay | Admitting: Internal Medicine

## 2019-03-28 ENCOUNTER — Other Ambulatory Visit: Payer: Self-pay

## 2019-03-28 ENCOUNTER — Other Ambulatory Visit: Payer: 59

## 2019-03-28 ENCOUNTER — Ambulatory Visit: Payer: 59 | Admitting: *Deleted

## 2019-03-28 ENCOUNTER — Encounter: Payer: Self-pay | Admitting: Advanced Practice Midwife

## 2019-03-28 ENCOUNTER — Ambulatory Visit (INDEPENDENT_AMBULATORY_CARE_PROVIDER_SITE_OTHER): Payer: 59 | Admitting: Advanced Practice Midwife

## 2019-03-28 VITALS — BP 117/68 | HR 82 | Wt 170.0 lb

## 2019-03-28 DIAGNOSIS — O0990 Supervision of high risk pregnancy, unspecified, unspecified trimester: Secondary | ICD-10-CM | POA: Insufficient documentation

## 2019-03-28 DIAGNOSIS — O24410 Gestational diabetes mellitus in pregnancy, diet controlled: Secondary | ICD-10-CM | POA: Diagnosis not present

## 2019-03-28 DIAGNOSIS — Z1389 Encounter for screening for other disorder: Secondary | ICD-10-CM

## 2019-03-28 DIAGNOSIS — O0993 Supervision of high risk pregnancy, unspecified, third trimester: Secondary | ICD-10-CM

## 2019-03-28 DIAGNOSIS — Z3A32 32 weeks gestation of pregnancy: Secondary | ICD-10-CM

## 2019-03-28 DIAGNOSIS — Z331 Pregnant state, incidental: Secondary | ICD-10-CM

## 2019-03-28 DIAGNOSIS — Z8632 Personal history of gestational diabetes: Secondary | ICD-10-CM | POA: Insufficient documentation

## 2019-03-28 LAB — POCT URINALYSIS DIPSTICK OB
Blood, UA: NEGATIVE
Glucose, UA: NEGATIVE
Ketones, UA: NEGATIVE
Leukocytes, UA: NEGATIVE
Nitrite, UA: NEGATIVE
POC,PROTEIN,UA: NEGATIVE

## 2019-03-28 MED ORDER — BLOOD PRESSURE MONITOR MISC: 0 refills | Status: AC

## 2019-03-28 NOTE — Patient Instructions (Signed)
Miranda Potts, I greatly value your feedback.  If you receive a survey following your visit with [place] today, we appreciate you taking the time to fill it out.  Thanks, Philipp Deputy CNM  [facility] HAS MOVED!!! It is now [facility] ([address] [place], [place] [address]) Entrance located off of [address] Free 24/7 valet parking   Nausea & Vomiting  Have saltine crackers or pretzels by your bed and eat a few bites before you raise your head out of bed in the morning  Eat small frequent meals throughout the day instead of large meals  Drink plenty of fluids throughout the day to stay hydrated, just don't drink a lot of fluids with your meals.  This can make your stomach fill up faster making you feel sick  Do not brush your teeth right after you eat  Products with real ginger are good for nausea, like ginger ale and ginger hard candy Make sure it says made with real ginger!  Sucking on sour candy like lemon heads is also good for nausea  If your prenatal vitamins make you nauseated, take them at night so you will sleep through the nausea  Sea Bands  If you feel like you need medicine for the nausea & vomiting please let us know  If you are unable to keep any fluids or food down please let us know   Constipation  Drink plenty of fluid, preferably water, throughout the day  Eat foods high in fiber such as fruits, vegetables, and grains  Exercise, such as walking, is a good way to keep your bowels regular  Drink warm fluids, especially warm prune juice, or decaf coffee  Eat a 1/2 cup of real oatmeal (not instant), 1/2 cup applesauce, and 1/2-1 cup warm prune juice every day  If needed, you may take Colace (docusate sodium) stool softener once or twice a day to help keep the stool soft.   If you still are having problems with constipation, you may take Miralax once daily as needed to help keep your bowels regular.   Home Blood Pressure  Monitoring for Patients   Your provider has recommended that you check your blood pressure (BP) at least once a week at home. If you do not have a blood pressure cuff at home, one will be provided for you. Contact your provider if you have not received your monitor within 1 week.   Helpful Tips for Accurate Home Blood Pressure Checks  . Don't smoke, exercise, or drink caffeine 30 minutes before checking your BP . Use the restroom before checking your BP (a full bladder can raise your pressure) . Relax in a comfortable upright chair . Feet on the ground . Left arm resting comfortably on a flat surface at the level of your heart . Legs uncrossed . Back supported . Sit quietly and don't talk . Place the cuff on your bare arm . Adjust snuggly, so that only two fingertips can fit between your skin and the top of the cuff . Check 2 readings separated by at least one minute . Keep a log of your BP readings . For a visual, please reference this diagram: http://ccnc.care/bpdiagram  Provider Name: [facility]     Phone: [phone]  Zone 1: ALL CLEAR  Continue to monitor your symptoms:  . BP reading is less than 140 (top number) or less than 90 (bottom number)  . No right upper stomach pain .  No headaches or seeing spots . No feeling nauseated or throwing up . No swelling in face and hands  Zone 2: CAUTION Call your doctor's office for any of the following:  . BP reading is greater than 140 (top number) or greater than 90 (bottom number)  . Stomach pain under your ribs in the middle or right side . Headaches or seeing spots . Feeling nauseated or throwing up . Swelling in face and hands  Zone 3: EMERGENCY  Seek immediate medical care if you have any of the following:  . BP reading is greater than160 (top number) or greater than 110 (bottom number) . Severe headaches not improving with Tylenol . Serious difficulty catching your breath . Any worsening symptoms from Zone 2     First Trimester of Pregnancy The first trimester of pregnancy is from week 1 until the end of week 12 (months 1 through 3). A week after a sperm fertilizes an egg, the egg will implant on the wall of the uterus. This embryo will begin to develop into a baby. Genes from you and your partner are forming the baby. The female genes determine whether the baby is a boy or a girl. At 6-8 weeks, the eyes and face are formed, and the heartbeat can be seen on ultrasound. At the end of 12 weeks, all the baby's organs are formed.  Now that you are pregnant, you will want to do everything you can to have a healthy baby. Two of the most important things are to get good prenatal care and to follow your health care provider's instructions. Prenatal care is all the medical care you receive before the baby's birth. This care will help prevent, find, and treat any problems during the pregnancy and childbirth. BODY CHANGES Your body goes through many changes during pregnancy. The changes vary from woman to woman.   You may gain or lose a couple of pounds at first.  You may feel sick to your stomach (nauseous) and throw up (vomit). If the vomiting is uncontrollable, call your health care provider.  You may tire easily.  You may develop headaches that can be relieved by medicines approved by your health care provider.  You may urinate more often. Painful urination may mean you have a bladder infection.  You may develop heartburn as a result of your pregnancy.  You may develop constipation because certain hormones are causing the muscles that push waste through your intestines to slow down.  You may develop hemorrhoids or swollen, bulging veins (varicose veins).  Your breasts may begin to grow larger and become tender. Your nipples may stick out more, and the tissue that surrounds them (areola) may become darker.  Your gums may bleed and may be sensitive to brushing and flossing.  Dark spots or blotches (chloasma,  mask of pregnancy) may develop on your face. This will likely fade after the baby is born.  Your menstrual periods will stop.  You may have a loss of appetite.  You may develop cravings for certain kinds of food.  You may have changes in your emotions from day to day, such as being excited to be pregnant or being concerned that something may go wrong with the pregnancy and baby.  You may have more vivid and strange dreams.  You may have changes in your hair. These can include thickening of your hair, rapid growth, and changes in texture. Some women also have hair loss during or after pregnancy, or hair that feels dry  or thin. Your hair will most likely return to normal after your baby is born. WHAT TO EXPECT AT YOUR PRENATAL VISITS During a routine prenatal visit:  You will be weighed to make sure you and the baby are growing normally.  Your blood pressure will be taken.  Your abdomen will be measured to track your baby's growth.  The fetal heartbeat will be listened to starting around week 10 or 12 of your pregnancy.  Test results from any previous visits will be discussed. Your health care provider may ask you:  How you are feeling.  If you are feeling the baby move.  If you have had any abnormal symptoms, such as leaking fluid, bleeding, severe headaches, or abdominal cramping.  If you have any questions. Other tests that may be performed during your first trimester include:  Blood tests to find your blood type and to check for the presence of any previous infections. They will also be used to check for low iron levels (anemia) and Rh antibodies. Later in the pregnancy, blood tests for diabetes will be done along with other tests if problems develop.  Urine tests to check for infections, diabetes, or protein in the urine.  An ultrasound to confirm the proper growth and development of the baby.  An amniocentesis to check for possible genetic problems.  Fetal screens for  spina bifida and Down syndrome.  You may need other tests to make sure you and the baby are doing well. HOME CARE INSTRUCTIONS  Medicines  Follow your health care provider's instructions regarding medicine use. Specific medicines may be either safe or unsafe to take during pregnancy.  Take your prenatal vitamins as directed.  If you develop constipation, try taking a stool softener if your health care provider approves. Diet  Eat regular, well-balanced meals. Choose a variety of foods, such as meat or vegetable-based protein, fish, milk and low-fat dairy products, vegetables, fruits, and whole grain breads and cereals. Your health care provider will help you determine the amount of weight gain that is right for you.  Avoid raw meat and uncooked cheese. These carry germs that can cause birth defects in the baby.  Eating four or five small meals rather than three large meals a day may help relieve nausea and vomiting. If you start to feel nauseous, eating a few soda crackers can be helpful. Drinking liquids between meals instead of during meals also seems to help nausea and vomiting.  If you develop constipation, eat more high-fiber foods, such as fresh vegetables or fruit and whole grains. Drink enough fluids to keep your urine clear or pale yellow. Activity and Exercise  Exercise only as directed by your health care provider. Exercising will help you:  Control your weight.  Stay in shape.  Be prepared for labor and delivery.  Experiencing pain or cramping in the lower abdomen or low back is a good sign that you should stop exercising. Check with your health care provider before continuing normal exercises.  Try to avoid standing for long periods of time. Move your legs often if you must stand in one place for a long time.  Avoid heavy lifting.  Wear low-heeled shoes, and practice good posture.  You may continue to have sex unless your health care provider directs you  otherwise. Relief of Pain or Discomfort  Wear a good support bra for breast tenderness.    Take warm sitz baths to soothe any pain or discomfort caused by hemorrhoids. Use hemorrhoid cream if your  health care provider approves.    Rest with your legs elevated if you have leg cramps or low back pain.  If you develop varicose veins in your legs, wear support hose. Elevate your feet for 15 minutes, 3-4 times a day. Limit salt in your diet. Prenatal Care  Schedule your prenatal visits by the twelfth week of pregnancy. They are usually scheduled monthly at first, then more often in the last 2 months before delivery.  Write down your questions. Take them to your prenatal visits.  Keep all your prenatal visits as directed by your health care provider. Safety  Wear your seat belt at all times when driving.  Make a list of emergency phone numbers, including numbers for family, friends, the hospital, and police and fire departments. General Tips  Ask your health care provider for a referral to a local prenatal education class. Begin classes no later than at the beginning of month 6 of your pregnancy.  Ask for help if you have counseling or nutritional needs during pregnancy. Your health care provider can offer advice or refer you to specialists for help with various needs.  Do not use hot tubs, steam rooms, or saunas.  Do not douche or use tampons or scented sanitary pads.  Do not cross your legs for long periods of time.  Avoid cat litter boxes and soil used by cats. These carry germs that can cause birth defects in the baby and possibly loss of the fetus by miscarriage or stillbirth.  Avoid all smoking, herbs, alcohol, and medicines not prescribed by your health care provider. Chemicals in these affect the formation and growth of the baby.  Schedule a dentist appointment. At home, brush your teeth with a soft toothbrush and be gentle when you floss. SEEK MEDICAL CARE IF:   You have  dizziness.  You have mild pelvic cramps, pelvic pressure, or nagging pain in the abdominal area.  You have persistent nausea, vomiting, or diarrhea.  You have a bad smelling vaginal discharge.  You have pain with urination.  You notice increased swelling in your face, hands, legs, or ankles. SEEK IMMEDIATE MEDICAL CARE IF:   You have a fever.  You are leaking fluid from your vagina.  You have spotting or bleeding from your vagina.  You have severe abdominal cramping or pain.  You have rapid weight gain or loss.  You vomit blood or material that looks like coffee grounds.  You are exposed to [place] measles and have never had them.  You are exposed to fifth disease or chickenpox.  You develop a severe headache.  You have shortness of breath.  You have any kind of trauma, such as from a fall or a car accident. Document Released: 02/10/2001 Document Revised: 07/03/2013 Document Reviewed: 12/27/2012 [facility] Patient Information 2015 [place], [place]. This information is not intended to replace advice given to you by your health care provider. Make sure you discuss any questions you have with your health care provider.  Coronavirus (COVID-19) Are you at risk?  Are you at risk for the Coronavirus (COVID-19)?  To be considered HIGH RISK for Coronavirus (COVID-19), you have to meet the following criteria:  . Traveled to [place], [place], [place], [place] or [place]; or in the [place] to [place], [place], [place], or [place]; and have fever, cough, and shortness of breath within the last 2 weeks of travel OR . Been in close contact with a person diagnosed with COVID-19 within the last 2 weeks and  have fever, cough, and shortness of breath . IF YOU DO NOT MEET THESE CRITERIA, YOU ARE CONSIDERED LOW RISK FOR COVID-19.  What to do if you are HIGH RISK for COVID-19?  Marland Kitchen If you are having a medical emergency, call 911. . Seek medical care right away. Before you go to a  doctor's office, urgent care or emergency department, call ahead and tell them about your recent travel, contact with someone diagnosed with COVID-19, and your symptoms. You should receive instructions from your physician's office regarding next steps of care.  . When you arrive at healthcare provider, tell the healthcare staff immediately you have returned from visiting [place], [place], [place], [place] or [place]; or traveled in the [place] to [place], [place], [place], or [place]; in the last two weeks or you have been in close contact with a person diagnosed with COVID-19 in the last 2 weeks.   . Tell the health care staff about your symptoms: fever, cough and shortness of breath. . After you have been seen by a medical provider, you will be either: o Tested for (COVID-19) and discharged home on quarantine except to seek medical care if symptoms worsen, and asked to  - Stay home and avoid contact with others until you get your results (4-5 days)  - Avoid travel on public transportation if possible (such as bus, train, or airplane) or o Sent to the Emergency Department by EMS for evaluation, COVID-19 testing, and possible admission depending on your condition and test results.  What to do if you are LOW RISK for COVID-19?  Reduce your risk of any infection by using the same precautions used for avoiding the common cold or flu:  Marland Kitchen Wash your hands often with soap and warm water for at least 20 seconds.  If soap and water are not readily available, use an alcohol-based hand sanitizer with at least 60% alcohol.  . If coughing or sneezing, cover your mouth and nose by coughing or sneezing into the elbow areas of your shirt or coat, into a tissue or into your sleeve (not your hands). . Avoid shaking hands with others and consider head nods or verbal greetings only. . Avoid touching your eyes, nose, or mouth with unwashed hands.  . Avoid close contact with people who are sick. . Avoid  places or events with large numbers of people in one location, like concerts or sporting events. . Carefully consider travel plans you have or are making. . If you are planning any travel outside or inside the [place], visit the CDC's Travelers' Health webpage for the latest health notices. . If you have some symptoms but not all symptoms, continue to monitor at home and seek medical attention if your symptoms worsen. . If you are having a medical emergency, call 911.   [place] / e-Visit: [email or web address]         [facility]: [facility]: [phone]                   [facility]: [phone]

## 2019-03-28 NOTE — Addendum Note (Signed)
Addended by: Cam Hai D on: 03/28/2019 01:33 PM   Modules accepted: Orders

## 2019-03-28 NOTE — Progress Notes (Addendum)
INITIAL OBSTETRICAL VISIT Patient name: Miranda Potts MRN [id number]  Date of birth: Mar 25, 1990 Chief Complaint:   Initial Prenatal Visit (tranfer care at 32wk from [facility])  History of Present Illness:   Miranda Potts is a 29 y.o. G77P0 African American female at [redacted]w[redacted]d by LMP and within 6d of 13wk scan with an Estimated Date of Delivery: 05/19/19 being seen today for her initial obstetrical visit.   Her obstetrical history is significant for dx of GDMA1 while at [facility]. Did have visit with nutritionist.   Reports of sugars: FBS 80s, 2hr low 100s, once was 130  Today she reports no complaints.  Patient's last menstrual period was 08/12/2018. Last pap Sept 2020. Results were: abnormal  ASCUS +HPV Review of Systems:   Pertinent items are noted in HPI Denies cramping/contractions, leakage of fluid, vaginal bleeding, abnormal vaginal discharge w/ itching/odor/irritation, headaches, visual changes, shortness of breath, chest pain, abdominal pain, severe nausea/vomiting, or problems with urination or bowel movements unless otherwise stated above.  Pertinent History Reviewed:  Reviewed past medical,surgical, social, obstetrical and family history.  Reviewed problem list, medications and allergies. OB History  Gravida Para Term Preterm AB Living  1            SAB TAB Ectopic Multiple Live Births               # Outcome Date GA Lbr Len/2nd Weight Sex Delivery Anes PTL Lv  1 Current            Physical Assessment:   Vitals:   03/28/19 1111  Weight: 170 lb (77.1 kg)  Body mass index is 29.18 kg/m.       Physical Examination:  General appearance - well appearing, and in no distress  Mental status - alert, oriented to person, place, and time  Psych:  She has a normal mood and affect  Skin - warm and dry, normal color, no suspicious lesions noted  Chest - effort normal, all lung fields clear to auscultation bilaterally  Heart - normal rate and regular rhythm  Abdomen -  soft, nontender  Extremities:  No swelling or varicosities noted  Pelvic - deferred  Thin prep pap is not done   Results for orders placed or performed in visit on 03/28/19 (from the past 24 hour(s))  POC Urinalysis Dipstick OB   Collection Time: 03/28/19 11:40 AM  Result Value Ref Range   Color, UA     Clarity, UA     Glucose, UA Negative Negative   Bilirubin, UA     Ketones, UA neg    Spec Grav, UA     Blood, UA neg    pH, UA     POC,PROTEIN,UA Negative Negative, Trace, Small (1+), Moderate (2+), Large (3+), 4+   Urobilinogen, UA     Nitrite, UA neg    Leukocytes, UA Negative Negative   Appearance     Odor      Assessment & Plan:  1) High-Risk Pregnancy G1P0 at [redacted]w[redacted]d with an Estimated Date of Delivery: 05/19/19   2) Initial OB visit  3) GDMA1> growth scan first avail, then HROB in 2wks; will get next growth scan ~ 36wks with IOL 39-40wks; encouraged to bring sugar log each visit  4) Pap with ASCUS, +HPV in Sept 2020> msg to pt to ask if she had colpo; if not, will need to schedule per [facility] (immediate risk of CIN3 is 4.4%)  Meds:  Meds ordered this encounter  Medications  .  Blood Pressure Monitor MISC    Sig: For regular home bp monitoring during pregnancy    Dispense:  1 each    Refill:  0    O09.90    Initial labs obtained Continue prenatal vitamins Reviewed n/v relief measures and warning s/s to report Reviewed recommended weight gain based on pre-gravid BMI Encouraged well-balanced diet Genetic Screening: NT/IT too late, neg AFP Cystic fibrosis, SMA, Fragile X screening discussed declined Ultrasound discussed; fetal survey: requested CCNC completed>PCM not here, form faxed The nature of [facility] [employer] with multiple MDs and other Advanced Practice Providers was explained to patient; also emphasized that fellows, residents, and students are part of our team. Order for home bp cuff. Check BP weekly, let us know if >140/90.    Follow-up: Return for first avail growth u/s (today?); then 2wks HROB in person.   Orders Placed This Encounter  Procedures  . US OB Comp + 14 Wk  . OB RESULTS CONSOLE RPR  . OB RESULTS CONSOLE HIV antibody  . OB RESULTS CONSOLE Rubella Antibody  . OB RESULTS CONSOLE Hepatitis B surface antigen  . POC Urinalysis Dipstick OB  . OB RESULTS CONSOLE ABO/Rh  . OB RESULTS CONSOLE Antibody Screen    Arabella Merles [facility] 03/28/2019 1:33 PM

## 2019-03-29 ENCOUNTER — Other Ambulatory Visit: Payer: Self-pay

## 2019-03-29 ENCOUNTER — Ambulatory Visit (INDEPENDENT_AMBULATORY_CARE_PROVIDER_SITE_OTHER): Payer: 59

## 2019-03-29 DIAGNOSIS — Z3A32 32 weeks gestation of pregnancy: Secondary | ICD-10-CM | POA: Diagnosis not present

## 2019-03-29 DIAGNOSIS — O24410 Gestational diabetes mellitus in pregnancy, diet controlled: Secondary | ICD-10-CM

## 2019-03-29 DIAGNOSIS — Z363 Encounter for antenatal screening for malformations: Secondary | ICD-10-CM

## 2019-03-29 NOTE — Progress Notes (Signed)
[place] 32+5 wks,frank breech,posterior placenta gr 3,normal ovaries,afi 11.3 cm,fhr 126 bpm,anterior fibroid 3 x 2 x 3.5 cm,efw 2630 g 97%,limited view because of fetal age

## 2019-04-11 ENCOUNTER — Encounter: Payer: 59 | Admitting: Obstetrics & Gynecology

## 2019-04-14 ENCOUNTER — Encounter: Payer: Self-pay | Admitting: Obstetrics & Gynecology

## 2019-04-14 ENCOUNTER — Other Ambulatory Visit: Payer: Self-pay

## 2019-04-14 ENCOUNTER — Ambulatory Visit (INDEPENDENT_AMBULATORY_CARE_PROVIDER_SITE_OTHER): Payer: 59 | Admitting: Obstetrics & Gynecology

## 2019-04-14 VITALS — BP 107/70 | HR 75 | Wt 177.0 lb

## 2019-04-14 DIAGNOSIS — Z331 Pregnant state, incidental: Secondary | ICD-10-CM

## 2019-04-14 DIAGNOSIS — O24410 Gestational diabetes mellitus in pregnancy, diet controlled: Secondary | ICD-10-CM

## 2019-04-14 DIAGNOSIS — Z3A35 35 weeks gestation of pregnancy: Secondary | ICD-10-CM

## 2019-04-14 DIAGNOSIS — O0990 Supervision of high risk pregnancy, unspecified, unspecified trimester: Secondary | ICD-10-CM

## 2019-04-14 DIAGNOSIS — Z1389 Encounter for screening for other disorder: Secondary | ICD-10-CM

## 2019-04-14 DIAGNOSIS — O0993 Supervision of high risk pregnancy, unspecified, third trimester: Secondary | ICD-10-CM

## 2019-04-14 LAB — POCT URINALYSIS DIPSTICK OB
Blood, UA: NEGATIVE
Glucose, UA: NEGATIVE
Ketones, UA: NEGATIVE
Leukocytes, UA: NEGATIVE
Nitrite, UA: NEGATIVE
POC,PROTEIN,UA: NEGATIVE

## 2019-04-14 NOTE — Progress Notes (Signed)
   HIGH-RISK PREGNANCY VISIT Patient name: Miranda Potts MRN [id number]  Date of birth: 1990/12/18 Chief Complaint:   Routine Prenatal Visit  History of Present Illness:   Miranda Potts is a 29 y.o. G1P0 female at [redacted]w[redacted]d with an Estimated Date of Delivery: 05/19/19 being seen today for ongoing management of a high-risk pregnancy complicated by Class A1 DM.  Today she reports no complaints. Contractions: Not present. Vag. Bleeding: None.  Movement: Present. denies leaking of fluid.  Review of Systems:   Pertinent items are noted in HPI Denies abnormal vaginal discharge w/ itching/odor/irritation, headaches, visual changes, shortness of breath, chest pain, abdominal pain, severe nausea/vomiting, or problems with urination or bowel movements unless otherwise stated above. Pertinent History Reviewed:  Reviewed past medical,surgical, social, obstetrical and family history.  Reviewed problem list, medications and allergies. Physical Assessment:   Vitals:   04/14/19 1054  BP: 107/70  Pulse: 75  Weight: 177 lb (80.3 kg)  Body mass index is 30.38 kg/m.           Physical Examination:   General appearance: alert, well appearing, and in no distress  Mental status: alert, oriented to person, place, and time  Skin: warm & dry   Extremities: Edema: None    Cardiovascular: normal heart rate noted  Respiratory: normal respiratory effort, no distress  Abdomen: gravid, soft, non-tender  Pelvic: Cervical exam deferred         Fetal Status:     Movement: Present    Fetal Surveillance Testing today: FHR 148   Chaperone: n/a    Results for orders placed or performed in visit on 04/14/19 (from the past 24 hour(s))  POC Urinalysis Dipstick OB   Collection Time: 04/14/19 10:55 AM  Result Value Ref Range   Color, UA     Clarity, UA     Glucose, UA Negative Negative   Bilirubin, UA     Ketones, UA n    Spec Grav, UA     Blood, UA n    pH, UA     POC,PROTEIN,UA Negative Negative, Trace,  Small (1+), Moderate (2+), Large (3+), 4+   Urobilinogen, UA     Nitrite, UA n    Leukocytes, UA Negative Negative   Appearance     Odor      Assessment & Plan:  1) High-risk pregnancy G1P0 at [redacted]w[redacted]d with an Estimated Date of Delivery: 05/19/19   2) Class A1 DM, stable, CBG excellent, EFW 97% 2 weeks ago, repeat 2 weeks    Meds: No orders of the defined types were placed in this encounter.   Labs/procedures today:   Treatment Plan:  Sonogram 2 weeks  Reviewed: Preterm labor symptoms and general obstetric precautions including but not limited to vaginal bleeding, contractions, leaking of fluid and fetal movement were reviewed in detail with the patient.  All questions were answered. Has home bp cuff. Rx faxed to . Check bp weekly, let us know if >140/90.   Follow-up: Return in about 2 weeks (around 04/28/2019) for sonogram EFW, HROB.  Orders Placed This Encounter  Procedures  . US OB Follow Up  . POC Urinalysis Dipstick OB   Miranda Potts Miranda Potts  04/14/2019 11:05 AM

## 2019-04-24 ENCOUNTER — Encounter: Payer: 59 | Admitting: Obstetrics and Gynecology

## 2019-04-28 ENCOUNTER — Encounter: Payer: Self-pay | Admitting: Obstetrics and Gynecology

## 2019-04-28 ENCOUNTER — Other Ambulatory Visit (HOSPITAL_COMMUNITY)
Admission: RE | Admit: 2019-04-28 | Discharge: 2019-04-28 | Disposition: A | Discharge status: Home / Self Care | Payer: 59 | Source: Ambulatory Visit | Attending: Obstetrics and Gynecology | Admitting: Obstetrics and Gynecology

## 2019-04-28 ENCOUNTER — Other Ambulatory Visit: Payer: Self-pay

## 2019-04-28 ENCOUNTER — Ambulatory Visit (INDEPENDENT_AMBULATORY_CARE_PROVIDER_SITE_OTHER): Payer: 59

## 2019-04-28 ENCOUNTER — Ambulatory Visit (INDEPENDENT_AMBULATORY_CARE_PROVIDER_SITE_OTHER): Payer: 59 | Admitting: Obstetrics and Gynecology

## 2019-04-28 VITALS — BP 122/66 | HR 74 | Wt 179.2 lb

## 2019-04-28 DIAGNOSIS — Z331 Pregnant state, incidental: Secondary | ICD-10-CM

## 2019-04-28 DIAGNOSIS — Z23 Encounter for immunization: Secondary | ICD-10-CM | POA: Diagnosis not present

## 2019-04-28 DIAGNOSIS — O0993 Supervision of high risk pregnancy, unspecified, third trimester: Secondary | ICD-10-CM

## 2019-04-28 DIAGNOSIS — O0990 Supervision of high risk pregnancy, unspecified, unspecified trimester: Secondary | ICD-10-CM | POA: Diagnosis present

## 2019-04-28 DIAGNOSIS — Z362 Encounter for other antenatal screening follow-up: Secondary | ICD-10-CM | POA: Diagnosis not present

## 2019-04-28 DIAGNOSIS — Z3A37 37 weeks gestation of pregnancy: Secondary | ICD-10-CM | POA: Diagnosis not present

## 2019-04-28 DIAGNOSIS — Z1389 Encounter for screening for other disorder: Secondary | ICD-10-CM

## 2019-04-28 DIAGNOSIS — O24410 Gestational diabetes mellitus in pregnancy, diet controlled: Secondary | ICD-10-CM

## 2019-04-28 LAB — POCT URINALYSIS DIPSTICK OB
Glucose, UA: NEGATIVE
Ketones, UA: NEGATIVE
Leukocytes, UA: NEGATIVE
Nitrite, UA: NEGATIVE
POC,PROTEIN,UA: NEGATIVE

## 2019-04-28 NOTE — Progress Notes (Signed)
Patient ID: Miranda Potts, female   DOB: [birth year], 29 y.o.   MRN: [id number]    [facility] PREGNANCY VISIT Patient name: Miranda Potts MRN [id number]  Date of birth: 05-Sep-1990 Chief Complaint:   High Risk Gestation ([place] today; GBS, GC/CHL)  History of Present Illness:   Miranda Potts is a 29 y.o. G1P0 female at [redacted]w[redacted]d with an Estimated Date of Delivery: 05/19/19 being seen today for ongoing management of a high-risk pregnancy complicated by diabetes mellitus A1DM.  Fastings are all <95  Post prandials are all mostly <120. An occasional pp over 120 never over 130. Today she reports no complaints. Contractions: Not present. Vag. Bleeding: None.  Movement: Present. denies leaking of fluid.  Review of Systems:   Pertinent items are noted in HPI Denies abnormal vaginal discharge w/ itching/odor/irritation, headaches, visual changes, shortness of breath, chest pain, abdominal pain, severe nausea/vomiting, or problems with urination or bowel movements unless otherwise stated above. Pertinent History Reviewed:  Reviewed past medical,surgical, social, obstetrical and family history.  Reviewed problem list, medications and allergies. Physical Assessment:   Vitals:   04/28/19 1120  BP: 122/66  Pulse: 74  Weight: 179 lb 3.2 oz (81.3 kg)  Body mass index is 30.76 kg/m.           Physical Examination:   General appearance: alert, well appearing, and in no distress  Mental status: alert, oriented to person, place, and time, normal mood, behavior, speech, dress, motor activity, and thought processes, affect appropriate to mood  Skin: warm & dry   Extremities: Edema: Trace    Cardiovascular: normal heart rate noted  Respiratory: normal respiratory effort, no distress  Abdomen: gravid, soft, non-tender  Pelvic: Cervical exam performed long and closed 1 cm        Fetal Status: Fetal Heart Rate (bpm): 129 u/s Fundal Height: 37 cm Movement: Present    Fetal Surveillance Testing today: [place] 37  wks,frank breech,posterior placenta gr 3,afi 7.9 cm,fhr 129 bpm,efw 3591 g 92%,limited view because of fetal age and position    Chaperone: Samul Dada    Results for orders placed or performed in visit on 04/28/19 (from the past 24 hour(s))  POC Urinalysis Dipstick OB   Collection Time: 04/28/19 11:15 AM  Result Value Ref Range   Color, UA     Clarity, UA     Glucose, UA Negative Negative   Bilirubin, UA     Ketones, UA neg    Spec Grav, UA     Blood, UA trace    pH, UA     POC,PROTEIN,UA Negative Negative, Trace, Small (1+), Moderate (2+), Large (3+), 4+   Urobilinogen, UA     Nitrite, UA neg    Leukocytes, UA Negative Negative   Appearance     Odor      Assessment & Plan:  1) High-risk pregnancy G1P0 at [redacted]w[redacted]d with an Estimated Date of Delivery: 05/19/19   2) A1DM, stable  EFW 92% at 37w  Meds: No orders of the defined types were placed in this encounter.  Labs/procedures today: u/s, GBS/GCCHL  Treatment Plan:  F/u in 1 week   Reviewed: Term labor symptoms and general obstetric precautions including but not limited to vaginal bleeding, contractions, leaking of fluid and fetal movement were reviewed in detail with the patient.  All questions were answered.   Follow-up: Return in about 1 week (around 05/05/2019).  Orders Placed This Encounter  Procedures  . Strep Gp B NAA  . Tdap vaccine greater than or  equal to 27yo IM  . POC Urinalysis Dipstick OB   By signing my name below, I, Arnette Norris, attest that this documentation has been prepared under the direction and in the presence of Tilda Burrow, MD. Electronically Signed: Arnette Norris Medical Scribe. 04/28/19. 12:06 PM. I personally performed the services described in this documentation, which was SCRIBED in my presence. The recorded information has been reviewed and considered accurate. It has been edited as necessary during review. Tilda Burrow, MD

## 2019-04-28 NOTE — Progress Notes (Signed)
[place] 37 wks,frank breech,posterior placenta gr 3,afi 7.9 cm,fhr 129 bpm,efw 3591 g 92%,limited view because of fetal age and position

## 2019-04-28 NOTE — Patient Instructions (Signed)
[facility] [place] °Call to Register: [phone] or [phone]   or   Register Online: [email or web address] °THESE CLASSES FILL UP VERY QUICKLY, SO SIGN UP AS SOON AS YOU CAN!!! °Please visit [facility] pregnancy website at [email or web address] ° °Childbirth Classes  °Option 1: Birth & Baby Series °? Series of 3 weekly classes, on the same day of the week (can choose Mon-Thurs) from 6-9pm °? Helps you and your support person prepare for childbirth °? Reviews newborn care, labor & birth, cesarean birth, pain management, and comfort techniques °? Cost: $60 per couple for insured or self-pay, $30 per couple for Medicaid ° °Option 2: Weekend Birth & Baby °? This class is a weekend version of our Birth & Baby series.  It is designed for parents who have a difficult time fitting several weeks of classes into their schedule.   °? Covers the care of your newborn and the basics of labor and childbirth °? Friday 6:30pm-8:30pm Saturday 9am-4pm, includes lunch for you and your partner  °? Cost: $75 per couple for insured or self-pay, $30 per couple for Medicaid ° °Option 3: Natural Childbirth °? This series of 5 weekly classes is for expectant parents who want to learn and practice natural methods of coping with the process of labor and childbirth.  Can choose Mon or Tues, 7-9pm.   °? Covers relaxation, breathing, massage, visualization, role of the partner, and helpful positioning °? Participants learn how to be confident in their body's ability to give birth. Class empowers and helps parents make informed decisions about care. Includes discussion that will help new parents transition into the immediate postpartum period.  °? Cost: $75 per couple for insured or self-pay, $30 per couple for Medicaid ° °Option 4: Online Birth & Baby °? This online class offers you the freedom to complete a Birth & Baby series in the comfort of your own home.  The flexibility of this option allows you to review  sections at your own pace, at times convenient to you and your support people.  It includes additional video information, animations, quizzes and extended activities. Get organized with helpful eClass tools, checklists, and trackers.  °? Cost: $60 for 60 days of online access °    °                                                                       °Other Available Classes ° °Baby & Me °Enjoy this time to discuss newborn & infant parenting topics and family adjustment issues with other new mothers in a relaxed environment. Each week brings a new speaker or baby-centered activity. We encourage mothers and their babies (birth to crawling) to join us. You are welcome to visit this group even if you haven't delivered yet! It's wonderful to make new friends early and watch other moms interact with their babies. No registration or fee.  °Big Brother/Big Sister °Let your children share in the joy of a new brother or sister in this special class designed just for them. Discussion includes how families care for babies: swaddling, holding, diapering, safety, as well as how they can be helpful in their new role. This class is designed for children ages 2 to 6, but any age is   welcome. Please register each child individually. $5 °Breastfeeding Support Group °This group is a mother-to-mother support circle where moms have the opportunity to share their breastfeeding experiences. A Breastfeeding Support nurse is present for questions and concerns. An infant scale is available for weight checks. No fee or registration.  °Breastfeeding Your Baby °Breastfeeding is a special time for mother and child. This class will help you feel ready to begin this important relationship. Your partner is encouraged to attend with you. Learn what to expect and feel more confident in the first days of breastfeeding your newborn. This class also addresses the most common fears and challenges of breastfeeding during the first few weeks, months, and  beyond. $30 per couple °Caring for Baby °This class is for expectant and adoptive parents who want to learn and practice the most up-to-date newborn care for their babies. Focus is on birth through first six weeks of life. Topics include feeding, bathing, diapering, crying, umbilical cord care, circumcision care and safe sleep. Parents learn how to recognize symptoms of illness and when to call the pediatrician. Register only the mom-to-be and your partner can plan to come with you. (*Note: This class is included in the Birth & Baby series and the Weekend Birth & Baby classes.) $10 per couple °Comfort Techniques & Tour °This 2-hour interactive class is designed for those who either do not wish to take the Birth & Baby series or for those who prefer our online childbirth class, but don't want to miss the opportunity to learn and practice hands-on techniques. These skills can help relieve some of the discomfort of labor and encourage your baby to rotate toward the best position for birth. You and your partner will be able to try a variety of labor positions with birth balls and rebozos as well as practice breathing, relaxation, and visual techniques. $20 per couple °Daddy Boot Camp °This course offers Dads-to-be the tools and knowledge needed to feel confident on their journey to becoming new fathers. Experienced dads, who have been trained as coaches, teach dads-to-be how to hold, comfort, diapers, swaddle and play with their infant while being able to support the new mom as well. $25 °Grandparent Love °Expecting a grandbaby? Learn about the latest infant care and safety recommendations and ways to support your own child as he or she transitions into the parenting role. $10 per person °Infant and Child CPR °Parents, grandparents, babysitters, and friends learn Cardio-Pulmonary Resuscitation skills for infants and children. You will also learn how to treat both conscious and unconscious choking infants and children.  Register each participant individually. (Note: This Family & Friends program does not offer certification.) $20 per person °Marvelous Multiples °Expecting twins, triplets, or more? This free 2-hour class covers the differences in labor, birth, parenting, and breastfeeding issues that face multiples' parents.  °Maternity Care Center Virtual Tour ° Online virtual tour of the new [facility] [place] ° °Mom Talk °This free mom-led group offers support and connection to mothers as they journey through the adjustments and struggles of that sometimes overwhelming first year after the birth of a child. A member of our staff will be present to share resources and additional support if needed, as you care for yourself and baby. You are welcome to visit this group before you deliver! It's wonderful to meet new friends early and watch other moms interact with their babies.  °Waterbirth Class °Interested in a waterbirth? This free informational class will help you discover whether   waterbirth is the right fit for you and is required if you are planning a waterbirth. Education about waterbirth itself, supplies you may need, and what you may need from your support team is included in this class. Partners are encouraged to come. °  ° °

## 2019-04-30 LAB — STREP GP B NAA: Strep Gp B NAA: POSITIVE — AB

## 2019-05-01 LAB — CERVICOVAGINAL ANCILLARY ONLY
Chlamydia: NEGATIVE
Comment: NEGATIVE
Comment: NORMAL
Neisseria Gonorrhea: NEGATIVE

## 2019-05-02 ENCOUNTER — Encounter: Payer: Self-pay | Admitting: *Deleted

## 2019-05-05 ENCOUNTER — Other Ambulatory Visit: Payer: Self-pay

## 2019-05-05 ENCOUNTER — Telehealth (INDEPENDENT_AMBULATORY_CARE_PROVIDER_SITE_OTHER): Payer: 59 | Admitting: Women's Health

## 2019-05-05 ENCOUNTER — Encounter: Payer: Self-pay | Admitting: Women's Health

## 2019-05-05 VITALS — BP 130/76 | HR 76

## 2019-05-05 DIAGNOSIS — O24410 Gestational diabetes mellitus in pregnancy, diet controlled: Secondary | ICD-10-CM

## 2019-05-05 DIAGNOSIS — O0990 Supervision of high risk pregnancy, unspecified, unspecified trimester: Secondary | ICD-10-CM

## 2019-05-05 DIAGNOSIS — O321XX0 Maternal care for breech presentation, not applicable or unspecified: Secondary | ICD-10-CM

## 2019-05-05 DIAGNOSIS — O0993 Supervision of high risk pregnancy, unspecified, third trimester: Secondary | ICD-10-CM

## 2019-05-05 DIAGNOSIS — Z3A38 38 weeks gestation of pregnancy: Secondary | ICD-10-CM

## 2019-05-05 NOTE — Patient Instructions (Signed)
Miranda Potts, I greatly value your feedback.  If you receive a survey following your visit with [place] today, we appreciate you taking the time to fill it out.  Thanks, Joellyn Haff, CNM, [facility]  [facility] HAS MOVED!!! It is now [facility] at [facility] ([address] [place], [place] [address]) Entrance located off of [address] Free 24/7 valet parking   Go to [email or web address] to register for FREE online childbirth classes    Call the office [phone]) or go to [facility] if:  You begin to have strong, frequent contractions  Your water breaks.  Sometimes it is a big gush of fluid, sometimes it is just a trickle that keeps getting your panties wet or running down your legs  You have vaginal bleeding.  It is normal to have a small amount of spotting if your cervix was checked.   You don't feel your baby moving like normal.  If you don't, get you something to eat and drink and lay down and focus on feeling your baby move.  You should feel at least 10 movements in 2 hours.  If you don't, you should call the office or go to [facility]   [place] Pediatricians/Family Doctors:  [facility] [phone]            [facility] [phone]                 [facility] [phone] (usually not accepting new patients unless you have family there already, you are always welcome to call and ask)       [facility] Department [phone]       [facility] Pediatricians/Family Doctors:   [facility]: [phone]  [facility]: [phone]  [facility]: [phone]  [facility] Doctors:   [facility]: [phone]   [facility]: [phone]  [facility] Doctors:  [facility]: [phone]    Home Blood Pressure Monitoring for Patients   Your provider has recommended that you check your blood  pressure (BP) at least once a week at home. If you do not have a blood pressure cuff at home, one will be provided for you. Contact your provider if you have not received your monitor within 1 week.   Helpful Tips for Accurate Home Blood Pressure Checks  . Don't smoke, exercise, or drink caffeine 30 minutes before checking your BP . Use the restroom before checking your BP (a full bladder can raise your pressure) . Relax in a comfortable upright chair . Feet on the ground . Left arm resting comfortably on a flat surface at the level of your heart . Legs uncrossed . Back supported . Sit quietly and don't talk . Place the cuff on your bare arm . Adjust snuggly, so that only two fingertips can fit between your skin and the top of the cuff . Check 2 readings separated by at least one minute . Keep a log of your BP readings . For a visual, please reference this diagram: http://ccnc.care/bpdiagram  Provider Name: [facility]     Phone: [phone]  Zone 1: ALL CLEAR  Continue to monitor your symptoms:  . BP reading is less than 140 (top number) or less than 90 (bottom number)  . No right upper stomach pain . No headaches or seeing spots . No feeling nauseated or throwing up . No swelling in face and hands  Zone 2: CAUTION Call your doctor's office  for any of the following:  . BP reading is greater than 140 (top number) or greater than 90 (bottom number)  . Stomach pain under your ribs in the middle or right side . Headaches or seeing spots . Feeling nauseated or throwing up . Swelling in face and hands  Zone 3: EMERGENCY  Seek immediate medical care if you have any of the following:  . BP reading is greater than160 (top number) or greater than 110 (bottom number) . Severe headaches not improving with Tylenol . Serious difficulty catching your breath Any worsening symptoms from Zone 2    Braxton Hicks Contractions Contractions of the uterus can occur throughout pregnancy,  but they are not always a sign that you are in labor. You may have practice contractions called Braxton Hicks contractions. These false labor contractions are sometimes confused with true labor. What are Deberah Pelton contractions? Braxton Hicks contractions are tightening movements that occur in the muscles of the uterus before labor. Unlike true labor contractions, these contractions do not result in opening (dilation) and thinning of the cervix. Toward the end of pregnancy (32-34 weeks), Braxton Hicks contractions can happen more often and may become stronger. These contractions are sometimes difficult to tell apart from true labor because they can be very uncomfortable. You should not feel embarrassed if you go to the hospital with false labor. Sometimes, the only way to tell if you are in true labor is for your health care provider to look for changes in the cervix. The health care provider will do a physical exam and may monitor your contractions. If you are not in true labor, the exam should show that your cervix is not dilating and your water has not broken. If there are no other health problems associated with your pregnancy, it is completely safe for you to be sent home with false labor. You may continue to have Braxton Hicks contractions until you go into true labor. How to tell the difference between true labor and false labor True labor  Contractions last 30-70 seconds.  Contractions become very regular.  Discomfort is usually felt in the top of the uterus, and it spreads to the lower abdomen and low back.  Contractions do not go away with walking.  Contractions usually become more intense and increase in frequency.  The cervix dilates and gets thinner. False labor  Contractions are usually shorter and not as strong as true labor contractions.  Contractions are usually irregular.  Contractions are often felt in the front of the lower abdomen and in the groin.  Contractions may  go away when you walk around or change positions while lying down.  Contractions get weaker and are shorter-lasting as time goes on.  The cervix usually does not dilate or become thin. Follow these instructions at home:   Take over-the-counter and prescription medicines only as told by your health care provider.  Keep up with your usual exercises and follow other instructions from your health care provider.  Eat and drink lightly if you think you are going into labor.  If Braxton Hicks contractions are making you uncomfortable: ? Change your position from lying down or resting to walking, or change from walking to resting. ? Sit and rest in a tub of warm water. ? Drink enough fluid to keep your urine pale yellow. Dehydration may cause these contractions. ? Do slow and deep breathing several times an hour.  Keep all follow-up prenatal visits as told by your health care provider. This  is important. Contact a health care provider if:  You have a fever.  You have continuous pain in your abdomen. Get help right away if:  Your contractions become stronger, more regular, and closer together.  You have fluid leaking or gushing from your vagina.  You pass blood-tinged mucus (bloody show).  You have bleeding from your vagina.  You have low back pain that you never had before.  You feel your baby's head pushing down and causing pelvic pressure.  Your baby is not moving inside you as much as it used to. Summary  Contractions that occur before labor are called Braxton Hicks contractions, false labor, or practice contractions.  Braxton Hicks contractions are usually shorter, weaker, farther apart, and less regular than true labor contractions. True labor contractions usually become progressively stronger and regular, and they become more frequent.  Manage discomfort from [facility] contractions by changing position, resting in a warm bath, drinking plenty of water, or practicing  deep breathing. This information is not intended to replace advice given to you by your health care provider. Make sure you discuss any questions you have with your health care provider. Document Revised: 01/29/2017 Document Reviewed: 07/02/2016 Elsevier Patient Education  [place].

## 2019-05-05 NOTE — Progress Notes (Signed)
   TELEHEALTH VIRTUAL OBSTETRICS VISIT ENCOUNTER NOTE Patient name: Miranda Potts MRN [id number]  Date of birth: 08-Aug-1990  I connected with patient on 05/05/19 at 12:50 PM EST by MyChart video and verified that I am speaking with the correct person using two identifiers. Due to COVID-19 recommendations, pt is not currently in our office.    I discussed the limitations, risks, security and privacy concerns of performing an evaluation and management service by telephone and the availability of in person appointments. I also discussed with the patient that there may be a patient responsible charge related to this service. The patient expressed understanding and agreed to proceed.  Chief Complaint:   Routine Prenatal Visit  History of Present Illness:   Miranda Potts is a 29 y.o. G1P0 female at [redacted]w[redacted]d with an Estimated Date of Delivery: 05/19/19 being evaluated today for ongoing management of a high-risk pregnancy.  Today she reports all sugars wnl. BP today a little higher than her normal. She does not think its accurate. Feels fine.  Denies ha, visual changes, ruq/epigastric pain, n/v.   Contractions: Not present. Vag. Bleeding: None.  Movement: Present. denies leaking of fluid. Review of Systems:   Pertinent items are noted in HPI Denies abnormal vaginal discharge w/ itching/odor/irritation, headaches, visual changes, shortness of breath, chest pain, abdominal pain, severe nausea/vomiting, or problems with urination or bowel movements unless otherwise stated above. Pertinent History Reviewed:  Reviewed past medical,surgical, social, obstetrical and family history.  Reviewed problem list, medications and allergies. Physical Assessment:   Vitals:   05/05/19 1237  BP: 130/76  Pulse: 76  There is no height or weight on file to calculate BMI.        Physical Examination:   General:  Alert, oriented and cooperative.   Mental Status: Normal mood and affect perceived. Normal judgment and  thought content.  Rest of physical exam deferred due to type of encounter  No results found for this or any previous visit (from the past 24 hour(s)).  Assessment & Plan:  1) Pregnancy G1P0 at [redacted]w[redacted]d with an Estimated Date of Delivery: 05/19/19   2) A1DM, stable, EFW 92% @ 37wks, AFI 7.9cm  3) BREECH> last week on u/s, does not feel like baby has flipped. Discussed w/ [facility], proposed trying ECV @ 39wks that way if unsuccessful she can do c/s while she's there, wants to think about it over weekend and will get back w/ pt on Monday.    Meds: No orders of the defined types were placed in this encounter.   Labs/procedures today: none  Plan:  Continue routine obstetrical care.  Has home bp cuff.  Check bp daily, let us know if >140/90.  Next visit: prefers will be in person for discussion of ECV vs C/S    Reviewed: Term labor symptoms and general obstetric precautions including but not limited to vaginal bleeding, contractions, leaking of fluid and fetal movement were reviewed in detail with the patient. The patient was advised to call back or seek an in-person office evaluation/go to [facility] at [facility] for any urgent or concerning symptoms. All questions were answered. Please refer to After Visit Summary for other counseling recommendations.    I provided 15 minutes of non-face-to-face time during this encounter.  Follow-up: Return for Monday @ 1:50pm w/ JVF.  No orders of the defined types were placed in this encounter.  Cheral Marker CNM, [facility] 05/05/2019 1:57 PM

## 2019-05-08 ENCOUNTER — Other Ambulatory Visit: Payer: Self-pay | Admitting: Obstetrics and Gynecology

## 2019-05-08 ENCOUNTER — Ambulatory Visit (INDEPENDENT_AMBULATORY_CARE_PROVIDER_SITE_OTHER): Payer: 59 | Admitting: Obstetrics and Gynecology

## 2019-05-08 ENCOUNTER — Encounter: Payer: 59 | Admitting: Women's Health

## 2019-05-08 ENCOUNTER — Other Ambulatory Visit: Payer: Self-pay

## 2019-05-08 ENCOUNTER — Encounter: Payer: Self-pay | Admitting: Obstetrics and Gynecology

## 2019-05-08 VITALS — BP 128/80 | HR 93 | Wt 182.0 lb

## 2019-05-08 DIAGNOSIS — O0993 Supervision of high risk pregnancy, unspecified, third trimester: Secondary | ICD-10-CM

## 2019-05-08 DIAGNOSIS — O0990 Supervision of high risk pregnancy, unspecified, unspecified trimester: Secondary | ICD-10-CM

## 2019-05-08 DIAGNOSIS — Z3A38 38 weeks gestation of pregnancy: Secondary | ICD-10-CM

## 2019-05-08 DIAGNOSIS — Z1389 Encounter for screening for other disorder: Secondary | ICD-10-CM

## 2019-05-08 DIAGNOSIS — Z331 Pregnant state, incidental: Secondary | ICD-10-CM

## 2019-05-08 LAB — POCT URINALYSIS DIPSTICK OB
Blood, UA: NEGATIVE
Glucose, UA: NEGATIVE
Ketones, UA: NEGATIVE
Leukocytes, UA: NEGATIVE
Nitrite, UA: NEGATIVE

## 2019-05-08 NOTE — Progress Notes (Signed)
   LOW-RISK PREGNANCY VISIT Patient name: Miranda Potts MRN [id number]  Date of birth: [birth year] Chief Complaint:   Follow-up (wanted to see about turning baby and blood pressure)  History of Present Illness:   Miranda Toman is a 29 y.o. G1P0 female at [redacted]w[redacted]d with an Estimated Date of Delivery: 05/19/19 being seen today for ongoing management of a low-risk pregnancy.other than persistent breech presentation.  After lengthy counsel over the option of External Cephalic Version, the patient declines that option. She has consulted the FOB, and the decision she has come up with is scheduled primary ceaarean at 39.0 wk. Today she reports no complaints. Contractions: Not present.  .  Movement: Present. denies leaking of fluid. Review of Systems:   Pertinent items are noted in HPI Denies abnormal vaginal discharge w/ itching/odor/irritation, headaches, visual changes, shortness of breath, chest pain, abdominal pain, severe nausea/vomiting, or problems with urination or bowel movements unless otherwise stated above. Pertinent History Reviewed:  Reviewed past medical,surgical, social, obstetrical and family history.  Reviewed problem list, medications and allergies. Physical Assessment:   Vitals:   05/08/19 1408  BP: 128/80  Pulse: 93  Weight: 182 lb (82.6 kg)  Body mass index is 31.24 kg/m.        Physical Examination:   General appearance: Well appearing, and in no distress  Mental status: Alert, oriented to person, place, and time  Skin: Warm & dry  Cardiovascular: Normal heart rate noted  Respiratory: Normal respiratory effort, no distress  Abdomen: Soft, gravid, nontender 37 cm breech on leopolds,EFW 7.5 lb  Pelvic: Cervical exam performed         Extremities: Edema: Trace  Fetal Status:     Movement: Present    Chaperone: Peggy Dones    Results for orders placed or performed in visit on 05/08/19 (from the past 24 hour(s))  POC Urinalysis Dipstick OB   Collection Time:  05/08/19  2:07 PM  Result Value Ref Range   Color, UA     Clarity, UA     Glucose, UA Negative Negative   Bilirubin, UA     Ketones, UA neg    Spec Grav, UA     Blood, UA neg    pH, UA     POC,PROTEIN,UA Trace Negative, Trace, Small (1+), Moderate (2+), Large (3+), 4+   Urobilinogen, UA     Nitrite, UA neg    Leukocytes, UA Negative Negative   Appearance     Odor      Assessment & Plan:  1) Low-risk pregnancy G1P0 at [redacted]w[redacted]d with an Estimated Date of Delivery: 05/19/19   2) Persistent breech presentation, for primary cesarean section.   Meds: No orders of the defined types were placed in this encounter.  Labs/procedures today: none  Plan:  Continue routine obstetrical care , schedule primary cesarean at 39 weeks, Saturday at 9:30 am. Next visit: prefers will be in person for postop check of incision    Reviewed: Term labor symptoms and general obstetric precautions including but not limited to vaginal bleeding, contractions, leaking of fluid and fetal movement were reviewed in detail with the patient.  All questions were answered. has home bp cuff. . Check bp weekly, let us know if >140/90. Pt knows to [facility] for labor or ROM,   Follow-up: No follow-ups on file.  Orders Placed This Encounter  Procedures  . POC Urinalysis Dipstick OB   Tilda Burrow  MD -[facility] 05/08/2019 3:48 PM

## 2019-05-09 ENCOUNTER — Encounter (INDEPENDENT_AMBULATORY_CARE_PROVIDER_SITE_OTHER): Payer: Self-pay | Admitting: Internal Medicine

## 2019-05-10 ENCOUNTER — Telehealth: Payer: Self-pay | Admitting: *Deleted

## 2019-05-10 ENCOUNTER — Encounter (HOSPITAL_COMMUNITY): Payer: Self-pay

## 2019-05-10 ENCOUNTER — Encounter (INDEPENDENT_AMBULATORY_CARE_PROVIDER_SITE_OTHER): Payer: Self-pay

## 2019-05-10 ENCOUNTER — Other Ambulatory Visit (INDEPENDENT_AMBULATORY_CARE_PROVIDER_SITE_OTHER): Payer: Self-pay

## 2019-05-10 ENCOUNTER — Other Ambulatory Visit (INDEPENDENT_AMBULATORY_CARE_PROVIDER_SITE_OTHER): Payer: Self-pay | Admitting: Internal Medicine

## 2019-05-10 MED ORDER — HYDROCORTISONE VALERATE 0.2 % EX OINT: 1.0000 "application " | TOPICAL_OINTMENT | Freq: Two times a day (BID) | CUTANEOUS | 0 refills | Status: DC

## 2019-05-10 NOTE — Telephone Encounter (Signed)
Pt got a call from the hospital so she is aware of what to do. She is having a c section Saturday and wants to know when her FMLA papers will be done. Please call. Thanks!! JSY

## 2019-05-10 NOTE — Patient Instructions (Addendum)
Miranda Potts  05/10/2019   Your procedure is scheduled on:  05/13/2019  Arrive at 0745 at Entrance C on [address] at [facility]  and [employer]. You are invited to use the FREE valet parking or use the Visitor's parking deck.  Pick up the phone at the desk and dial [phone].  Call this number if you have problems the morning of surgery: [phone]  Remember:   Do not eat food:(After Midnight) Desps de medianoche.  Do not drink clear liquids: (After Midnight) Desps de medianoche.  Take these medicines the morning of surgery with A SIP OF WATER:  none   Do not wear jewelry, make-up or nail polish.  Do not wear lotions, powders, or perfumes. Do not wear deodorant.  Do not shave 48 hours prior to surgery.  Do not bring valuables to the hospital.  [facility] is not   responsible for any belongings or valuables brought to the hospital.  Contacts, dentures or bridgework may not be worn into surgery.  Leave suitcase in the car. After surgery it may be brought to your room.  For patients admitted to the hospital, checkout time is 11:00 AM the day of              discharge.      Please read over the following fact sheets that you were given:     Preparing for Surgery

## 2019-05-10 NOTE — Telephone Encounter (Signed)
Pt is scheduled for labs and covid testing tomorrow. Has questions. Please call. Thanks!!

## 2019-05-11 ENCOUNTER — Other Ambulatory Visit (HOSPITAL_COMMUNITY)
Admission: RE | Admit: 2019-05-11 | Discharge: 2019-05-11 | Disposition: A | Discharge status: Home / Self Care | Payer: 59 | Source: Ambulatory Visit | Attending: Obstetrics and Gynecology | Admitting: Obstetrics and Gynecology

## 2019-05-11 ENCOUNTER — Other Ambulatory Visit: Payer: Self-pay

## 2019-05-11 LAB — CBC
HCT: 39.4 % (ref 36.0–46.0)
Hemoglobin: 12.5 g/dL (ref 12.0–15.0)
MCH: 25.7 pg — ABNORMAL LOW (ref 26.0–34.0)
MCHC: 31.7 g/dL (ref 30.0–36.0)
MCV: 80.9 fL (ref 80.0–100.0)
Platelets: 208 10*3/uL (ref 150–400)
RBC: 4.87 MIL/uL (ref 3.87–5.11)
RDW: 16.5 % — ABNORMAL HIGH (ref 11.5–15.5)
WBC: 5.1 10*3/uL (ref 4.0–10.5)
nRBC: 0 % (ref 0.0–0.2)

## 2019-05-11 LAB — TYPE AND SCREEN
ABO/RH(D): O POS
Antibody Screen: NEGATIVE

## 2019-05-11 LAB — ABO/RH: ABO/RH(D): O POS

## 2019-05-11 LAB — SARS CORONAVIRUS 2 (TAT 6-24 HRS): SARS Coronavirus 2: NEGATIVE

## 2019-05-11 LAB — RPR: RPR Ser Ql: NONREACTIVE

## 2019-05-13 ENCOUNTER — Inpatient Hospital Stay (HOSPITAL_COMMUNITY)
Admission: RE | Admit: 2019-05-13 | Discharge: 2019-05-15 | DRG: 788 | Disposition: A | Discharge status: Home / Self Care | Payer: 59 | Attending: Obstetrics and Gynecology | Admitting: Obstetrics and Gynecology

## 2019-05-13 ENCOUNTER — Other Ambulatory Visit: Payer: Self-pay

## 2019-05-13 ENCOUNTER — Encounter (HOSPITAL_COMMUNITY)
Admission: RE | Disposition: A | Discharge status: Home / Self Care | Payer: Self-pay | Source: Home / Self Care | Attending: Obstetrics and Gynecology

## 2019-05-13 ENCOUNTER — Inpatient Hospital Stay (HOSPITAL_COMMUNITY): Payer: 59 | Admitting: Anesthesiology

## 2019-05-13 ENCOUNTER — Encounter (HOSPITAL_COMMUNITY): Payer: Self-pay | Admitting: Obstetrics and Gynecology

## 2019-05-13 DIAGNOSIS — Z3A39 39 weeks gestation of pregnancy: Secondary | ICD-10-CM | POA: Diagnosis not present

## 2019-05-13 DIAGNOSIS — O99824 Streptococcus B carrier state complicating childbirth: Secondary | ICD-10-CM | POA: Diagnosis present

## 2019-05-13 DIAGNOSIS — Z8632 Personal history of gestational diabetes: Secondary | ICD-10-CM | POA: Diagnosis present

## 2019-05-13 DIAGNOSIS — Z20822 Contact with and (suspected) exposure to covid-19: Secondary | ICD-10-CM | POA: Diagnosis present

## 2019-05-13 DIAGNOSIS — O24420 Gestational diabetes mellitus in childbirth, diet controlled: Secondary | ICD-10-CM | POA: Diagnosis present

## 2019-05-13 DIAGNOSIS — O0933 Supervision of pregnancy with insufficient antenatal care, third trimester: Secondary | ICD-10-CM

## 2019-05-13 DIAGNOSIS — O321XX0 Maternal care for breech presentation, not applicable or unspecified: Secondary | ICD-10-CM | POA: Diagnosis not present

## 2019-05-13 LAB — GLUCOSE, CAPILLARY
Glucose-Capillary: 74 mg/dL (ref 70–99)
Glucose-Capillary: 71 mg/dL (ref 70–99)

## 2019-05-13 SURGERY — Surgical Case
Anesthesia: Spinal

## 2019-05-13 MED ORDER — LIDOCAINE HCL (PF) 1 % IJ SOLN
INTRAMUSCULAR | Status: AC
  Filled 2019-05-13: qty 5

## 2019-05-13 MED ORDER — KETOROLAC TROMETHAMINE 30 MG/ML IJ SOLN
30.0000 mg | Freq: Once | INTRAMUSCULAR | Status: AC | PRN
  Administered 2019-05-13: 30 mg via INTRAVENOUS

## 2019-05-13 MED ORDER — HYDROMORPHONE HCL 1 MG/ML IJ SOLN: 0.2500 mg | INTRAMUSCULAR | Status: DC | PRN

## 2019-05-13 MED ORDER — OXYTOCIN 40 UNITS IN NORMAL SALINE INFUSION - SIMPLE MED: 2.5000 [IU]/h | INTRAVENOUS | Status: AC

## 2019-05-13 MED ORDER — NALOXONE HCL 4 MG/10ML IJ SOLN
1.0000 ug/kg/h | INTRAVENOUS | Status: DC | PRN
  Filled 2019-05-13: qty 5

## 2019-05-13 MED ORDER — EPHEDRINE SULFATE 50 MG/ML IJ SOLN
INTRAMUSCULAR | Status: DC | PRN
  Administered 2019-05-13 (×2): 5 mg via INTRAVENOUS

## 2019-05-13 MED ORDER — OXYCODONE HCL 5 MG PO TABS: 5.0000 mg | ORAL_TABLET | ORAL | Status: DC | PRN

## 2019-05-13 MED ORDER — FAMOTIDINE 20 MG PO TABS
20.0000 mg | ORAL_TABLET | Freq: Once | ORAL | Status: AC
  Administered 2019-05-13: 20 mg via ORAL

## 2019-05-13 MED ORDER — MEPERIDINE HCL 25 MG/ML IJ SOLN: 6.2500 mg | INTRAMUSCULAR | Status: DC | PRN

## 2019-05-13 MED ORDER — ACETAMINOPHEN 325 MG PO TABS
650.0000 mg | ORAL_TABLET | Freq: Four times a day (QID) | ORAL | Status: DC | PRN
  Administered 2019-05-15: 650 mg via ORAL
  Filled 2019-05-13: qty 2

## 2019-05-13 MED ORDER — MORPHINE SULFATE (PF) 0.5 MG/ML IJ SOLN
INTRAMUSCULAR | Status: AC
  Filled 2019-05-13: qty 10

## 2019-05-13 MED ORDER — ONDANSETRON HCL 4 MG/2ML IJ SOLN
INTRAMUSCULAR | Status: AC
  Filled 2019-05-13: qty 2

## 2019-05-13 MED ORDER — SIMETHICONE 80 MG PO CHEW
80.0000 mg | CHEWABLE_TABLET | ORAL | Status: DC
  Administered 2019-05-13 – 2019-05-14 (×3): 80 mg via ORAL
  Filled 2019-05-13 (×3): qty 1

## 2019-05-13 MED ORDER — FAMOTIDINE 20 MG PO TABS
ORAL_TABLET | ORAL | Status: AC
  Filled 2019-05-13: qty 1

## 2019-05-13 MED ORDER — SODIUM CHLORIDE 0.9% FLUSH: 3.0000 mL | INTRAVENOUS | Status: DC | PRN

## 2019-05-13 MED ORDER — ENOXAPARIN SODIUM 40 MG/0.4ML ~~LOC~~ SOLN
40.0000 mg | SUBCUTANEOUS | Status: DC
  Administered 2019-05-14: 40 mg via SUBCUTANEOUS
  Filled 2019-05-13 (×2): qty 0.4

## 2019-05-13 MED ORDER — ACETAMINOPHEN 500 MG PO TABS
1000.0000 mg | ORAL_TABLET | Freq: Once | ORAL | Status: AC
  Administered 2019-05-13: 1000 mg via ORAL

## 2019-05-13 MED ORDER — SCOPOLAMINE 1 MG/3DAYS TD PT72
1.0000 | MEDICATED_PATCH | Freq: Once | TRANSDERMAL | Status: DC
  Administered 2019-05-13: 1.5 mg via TRANSDERMAL

## 2019-05-13 MED ORDER — WITCH HAZEL-GLYCERIN EX PADS: 1.0000 "application " | MEDICATED_PAD | CUTANEOUS | Status: DC | PRN

## 2019-05-13 MED ORDER — ZOLPIDEM TARTRATE 5 MG PO TABS: 5.0000 mg | ORAL_TABLET | Freq: Every evening | ORAL | Status: DC | PRN

## 2019-05-13 MED ORDER — DIPHENHYDRAMINE HCL 25 MG PO CAPS
25.0000 mg | ORAL_CAPSULE | ORAL | Status: DC | PRN
  Administered 2019-05-14: 25 mg via ORAL
  Filled 2019-05-13: qty 1

## 2019-05-13 MED ORDER — TETANUS-DIPHTH-ACELL PERTUSSIS 5-2.5-18.5 LF-MCG/0.5 IM SUSP
0.5000 mL | Freq: Once | INTRAMUSCULAR | Status: DC
  Filled 2019-05-13: qty 0.5

## 2019-05-13 MED ORDER — EPHEDRINE 5 MG/ML INJ
INTRAVENOUS | Status: AC
  Filled 2019-05-13: qty 10

## 2019-05-13 MED ORDER — BUPIVACAINE IN DEXTROSE 0.75-8.25 % IT SOLN
INTRATHECAL | Status: DC | PRN
  Administered 2019-05-13: 1.6 mL via INTRATHECAL

## 2019-05-13 MED ORDER — PHENYLEPHRINE HCL-NACL 20-0.9 MG/250ML-% IV SOLN
INTRAVENOUS | Status: AC
  Filled 2019-05-13: qty 250

## 2019-05-13 MED ORDER — PROMETHAZINE HCL 25 MG/ML IJ SOLN: 6.2500 mg | INTRAMUSCULAR | Status: DC | PRN

## 2019-05-13 MED ORDER — DIPHENHYDRAMINE HCL 50 MG/ML IJ SOLN
12.5000 mg | Freq: Four times a day (QID) | INTRAMUSCULAR | Status: DC | PRN
  Administered 2019-05-13: 12.5 mg via INTRAVENOUS
  Filled 2019-05-13: qty 1

## 2019-05-13 MED ORDER — ONDANSETRON HCL 4 MG/2ML IJ SOLN
4.0000 mg | Freq: Three times a day (TID) | INTRAMUSCULAR | Status: DC | PRN
  Administered 2019-05-13: 4 mg via INTRAVENOUS
  Filled 2019-05-13: qty 2

## 2019-05-13 MED ORDER — NALBUPHINE HCL 10 MG/ML IJ SOLN: 5.0000 mg | INTRAMUSCULAR | Status: DC | PRN

## 2019-05-13 MED ORDER — MENTHOL 3 MG MT LOZG
1.0000 | LOZENGE | OROMUCOSAL | Status: DC | PRN
  Filled 2019-05-13: qty 9

## 2019-05-13 MED ORDER — FENTANYL CITRATE (PF) 100 MCG/2ML IJ SOLN
INTRAMUSCULAR | Status: DC | PRN
  Administered 2019-05-13: 15 ug via INTRAVENOUS

## 2019-05-13 MED ORDER — IBUPROFEN 800 MG PO TABS
800.0000 mg | ORAL_TABLET | Freq: Four times a day (QID) | ORAL | Status: DC
  Administered 2019-05-14 – 2019-05-15 (×5): 800 mg via ORAL
  Filled 2019-05-13 (×5): qty 1

## 2019-05-13 MED ORDER — NALOXONE HCL 0.4 MG/ML IJ SOLN: 0.4000 mg | INTRAMUSCULAR | Status: DC | PRN

## 2019-05-13 MED ORDER — DIBUCAINE (PERIANAL) 1 % EX OINT
1.0000 "application " | TOPICAL_OINTMENT | CUTANEOUS | Status: DC | PRN
  Filled 2019-05-13: qty 28

## 2019-05-13 MED ORDER — LACTATED RINGERS IV SOLN: INTRAVENOUS | Status: DC | PRN

## 2019-05-13 MED ORDER — OXYCODONE HCL 5 MG/5ML PO SOLN: 5.0000 mg | Freq: Once | ORAL | Status: DC | PRN

## 2019-05-13 MED ORDER — MORPHINE SULFATE (PF) 0.5 MG/ML IJ SOLN
INTRAMUSCULAR | Status: DC | PRN
  Administered 2019-05-13: .15 mg via EPIDURAL

## 2019-05-13 MED ORDER — KETOROLAC TROMETHAMINE 30 MG/ML IJ SOLN
INTRAMUSCULAR | Status: AC
  Filled 2019-05-13: qty 1

## 2019-05-13 MED ORDER — KETOROLAC TROMETHAMINE 30 MG/ML IJ SOLN
30.0000 mg | Freq: Four times a day (QID) | INTRAMUSCULAR | Status: AC
  Administered 2019-05-13 – 2019-05-14 (×3): 30 mg via INTRAVENOUS
  Filled 2019-05-13 (×3): qty 1

## 2019-05-13 MED ORDER — CEFAZOLIN SODIUM-DEXTROSE 2-4 GM/100ML-% IV SOLN
2.0000 g | INTRAVENOUS | Status: AC
  Administered 2019-05-13: 2 g via INTRAVENOUS

## 2019-05-13 MED ORDER — DIPHENHYDRAMINE HCL 25 MG PO CAPS: 25.0000 mg | ORAL_CAPSULE | Freq: Four times a day (QID) | ORAL | Status: DC | PRN

## 2019-05-13 MED ORDER — SIMETHICONE 80 MG PO CHEW
80.0000 mg | CHEWABLE_TABLET | Freq: Three times a day (TID) | ORAL | Status: DC
  Administered 2019-05-14 – 2019-05-15 (×2): 80 mg via ORAL
  Filled 2019-05-13 (×4): qty 1

## 2019-05-13 MED ORDER — PRENATAL MULTIVITAMIN CH
1.0000 | ORAL_TABLET | Freq: Every day | ORAL | Status: DC
  Administered 2019-05-14 – 2019-05-15 (×2): 1 via ORAL
  Filled 2019-05-13 (×3): qty 1

## 2019-05-13 MED ORDER — ONDANSETRON HCL 4 MG/2ML IJ SOLN
4.0000 mg | INTRAMUSCULAR | Status: DC | PRN
  Administered 2019-05-13: 4 mg via INTRAVENOUS
  Filled 2019-05-13: qty 2

## 2019-05-13 MED ORDER — OXYCODONE HCL 5 MG PO TABS: 5.0000 mg | ORAL_TABLET | Freq: Once | ORAL | Status: DC | PRN

## 2019-05-13 MED ORDER — NALBUPHINE HCL 10 MG/ML IJ SOLN: 5.0000 mg | Freq: Once | INTRAMUSCULAR | Status: DC | PRN

## 2019-05-13 MED ORDER — LACTATED RINGERS IV SOLN: INTRAVENOUS | Status: DC

## 2019-05-13 MED ORDER — PHENYLEPHRINE HCL-NACL 20-0.9 MG/250ML-% IV SOLN
INTRAVENOUS | Status: DC | PRN
  Administered 2019-05-13: 60 ug/min via INTRAVENOUS

## 2019-05-13 MED ORDER — OXYTOCIN 40 UNITS IN NORMAL SALINE INFUSION - SIMPLE MED
INTRAVENOUS | Status: DC | PRN
  Administered 2019-05-13: 40 [IU] via INTRAVENOUS

## 2019-05-13 MED ORDER — OXYTOCIN 40 UNITS IN NORMAL SALINE INFUSION - SIMPLE MED
INTRAVENOUS | Status: AC
  Filled 2019-05-13: qty 1000

## 2019-05-13 MED ORDER — SCOPOLAMINE 1 MG/3DAYS TD PT72
MEDICATED_PATCH | TRANSDERMAL | Status: AC
  Filled 2019-05-13: qty 1

## 2019-05-13 MED ORDER — SODIUM CHLORIDE 0.9 % IV SOLN: INTRAVENOUS | Status: DC | PRN

## 2019-05-13 MED ORDER — ACETAMINOPHEN 500 MG PO TABS
ORAL_TABLET | ORAL | Status: AC
  Filled 2019-05-13: qty 2

## 2019-05-13 MED ORDER — FENTANYL CITRATE (PF) 100 MCG/2ML IJ SOLN
INTRAMUSCULAR | Status: AC
  Filled 2019-05-13: qty 2

## 2019-05-13 MED ORDER — CEFAZOLIN SODIUM-DEXTROSE 2-4 GM/100ML-% IV SOLN
INTRAVENOUS | Status: AC
  Filled 2019-05-13: qty 100

## 2019-05-13 MED ORDER — SIMETHICONE 80 MG PO CHEW
80.0000 mg | CHEWABLE_TABLET | ORAL | Status: DC | PRN
  Filled 2019-05-13: qty 1

## 2019-05-13 MED ORDER — ONDANSETRON HCL 4 MG/2ML IJ SOLN
INTRAMUSCULAR | Status: DC | PRN
  Administered 2019-05-13: 4 mg via INTRAVENOUS

## 2019-05-13 MED ORDER — COCONUT OIL OIL
1.0000 "application " | TOPICAL_OIL | Status: DC | PRN
  Filled 2019-05-13: qty 120

## 2019-05-13 MED ORDER — SENNOSIDES-DOCUSATE SODIUM 8.6-50 MG PO TABS
2.0000 | ORAL_TABLET | ORAL | Status: DC
  Administered 2019-05-13 – 2019-05-14 (×2): 2 via ORAL
  Filled 2019-05-13 (×3): qty 2

## 2019-05-13 SURGICAL SUPPLY — 37 items
BENZOIN TINCTURE PRP APPL 2/3 (GAUZE/BANDAGES/DRESSINGS) ×2 IMPLANT
CHLORAPREP W/TINT 26ML (MISCELLANEOUS) ×2 IMPLANT
CLAMP CORD UMBIL (MISCELLANEOUS) IMPLANT
CLOTH BEACON ORANGE TIMEOUT ST (SAFETY) ×2 IMPLANT
DRSG OPSITE POSTOP 4X10 (GAUZE/BANDAGES/DRESSINGS) ×2 IMPLANT
ELECT REM PT RETURN 9FT ADLT (ELECTROSURGICAL) ×2
ELECTRODE REM PT RTRN 9FT ADLT (ELECTROSURGICAL) ×1 IMPLANT
EXTRACTOR VACUUM KIWI (MISCELLANEOUS) IMPLANT
GLOVE BIOGEL PI IND STRL 7.0 (GLOVE) ×1 IMPLANT
GLOVE BIOGEL PI IND STRL 9 (GLOVE) ×1 IMPLANT
GLOVE BIOGEL PI INDICATOR 7.0 (GLOVE) ×1
GLOVE BIOGEL PI INDICATOR 9 (GLOVE) ×1
GLOVE SS PI 9.0 STRL (GLOVE) ×2 IMPLANT
GOWN STRL REUS W/TWL 2XL LVL3 (GOWN DISPOSABLE) ×2 IMPLANT
GOWN STRL REUS W/TWL LRG LVL3 (GOWN DISPOSABLE) ×2 IMPLANT
NEEDLE HYPO 25X5/8 SAFETYGLIDE (NEEDLE) IMPLANT
NS IRRIG 1000ML POUR BTL (IV SOLUTION) ×2 IMPLANT
PACK C SECTION WH (CUSTOM PROCEDURE TRAY) ×2 IMPLANT
PAD OB MATERNITY 4.3X12.25 (PERSONAL CARE ITEMS) ×2 IMPLANT
PENCIL SMOKE EVAC W/HOLSTER (ELECTROSURGICAL) ×2 IMPLANT
RTRCTR C-SECT PINK 25CM LRG (MISCELLANEOUS) IMPLANT
RTRCTR C-SECT PINK 34CM XLRG (MISCELLANEOUS) IMPLANT
STRIP CLOSURE SKIN 1/2X4 (GAUZE/BANDAGES/DRESSINGS) IMPLANT
STRIP SURGICAL 1/2 X 6 IN (GAUZE/BANDAGES/DRESSINGS) ×2 IMPLANT
SUT MNCRL 0 VIOLET CTX 36 (SUTURE) ×2 IMPLANT
SUT MONOCRYL 0 CTX 36 (SUTURE) ×2
SUT PLAIN 2 0 (SUTURE) ×1
SUT PLAIN ABS 2-0 CT1 27XMFL (SUTURE) ×1 IMPLANT
SUT VIC AB 0 CT1 27 (SUTURE) ×1
SUT VIC AB 0 CT1 27XBRD ANBCTR (SUTURE) ×1 IMPLANT
SUT VIC AB 2-0 CT1 27 (SUTURE) ×1
SUT VIC AB 2-0 CT1 TAPERPNT 27 (SUTURE) ×1 IMPLANT
SUT VIC AB 4-0 KS 27 (SUTURE) ×2 IMPLANT
SYR BULB IRRIGATION 50ML (SYRINGE) IMPLANT
TOWEL OR 17X24 6PK STRL BLUE (TOWEL DISPOSABLE) ×2 IMPLANT
TRAY FOLEY W/BAG SLVR 14FR LF (SET/KITS/TRAYS/PACK) ×2 IMPLANT
WATER STERILE IRR 1000ML POUR (IV SOLUTION) ×2 IMPLANT

## 2019-05-13 NOTE — Transfer of Care (Signed)
Immediate Anesthesia Transfer of Care Note  Patient: Miranda Potts  Procedure(s) Performed: CESAREAN SECTION (N/A )  Patient Location: PACU  Anesthesia Type:Spinal  Level of Consciousness: awake, alert  and oriented  Airway & Oxygen Therapy: Patient Spontanous Breathing  Post-op Assessment: Report given to RN and Post -op Vital signs reviewed and stable  Post vital signs: Reviewed and stable  Last Vitals:  Vitals Value Taken Time  BP 122/67 05/13/19 1115  Temp    Pulse 57 05/13/19 1126  Resp 13 05/13/19 1126  SpO2 100 % 05/13/19 1126  Vitals shown include unvalidated device data.  Last Pain:  Vitals:   05/13/19 1115  TempSrc: (P) Oral  PainSc:          Complications: No apparent anesthesia complications

## 2019-05-13 NOTE — Anesthesia Postprocedure Evaluation (Signed)
Anesthesia Post Note  Patient: Miranda Potts  Procedure(s) Performed: CESAREAN SECTION (N/A )     Patient location during evaluation: PACU Anesthesia Type: Combined Spinal/Epidural Level of consciousness: oriented and awake and alert Pain management: pain level controlled Vital Signs Assessment: post-procedure vital signs reviewed and stable Respiratory status: spontaneous breathing, respiratory function stable and patient connected to nasal cannula oxygen Cardiovascular status: blood pressure returned to baseline and stable Postop Assessment: no headache, no backache, no apparent nausea or vomiting and spinal receding Anesthetic complications: no    Last Vitals:  Vitals:   05/13/19 1237 05/13/19 1340  BP: 124/70 129/74  Pulse: (!) 57 (!) 55  Resp: 17 17  Temp: 36.4 C 36.5 C  SpO2: 100% 100%    Last Pain:  Vitals:   05/13/19 1340  TempSrc: Oral  PainSc:    Pain Goal:                   Miranda Potts Miranda Potts

## 2019-05-13 NOTE — H&P (Signed)
Obstetric Preoperative History and Physical  Miranda Potts is a 29 y.o. G1P0 with IUP at [redacted]w[redacted]d presenting for scheduled cesarean section.  Reports good fetal movement, no bleeding, no contractions, no leaking of fluid.  No acute preoperative concerns.    Cesarean Section Indication: malpresentation: breech  Prenatal Course Source of Care: FT with onset of care at 32 weeks  Pregnancy complications or risks: Patient Active Problem List   Diagnosis Date Noted  . Breech presentation 05/05/2019  . Supervision of high risk pregnancy, antepartum 03/28/2019  . Gestational diabetes mellitus, class A1 03/28/2019  . Environmental allergies 10/29/2015  . Superficial acne vulgaris 10/29/2015   She plans to bottle feed She desires no method for postpartum contraception.   Prenatal labs and studies: ABO, Rh: --/--/O POS, O POS Performed at [facility], [address], [place], [place] [address]  [phone]) Antibody: NEG (03/11 0932) Rubella: Immune (08/09 0000) RPR: NON REACTIVE (03/11 0927)  HBsAg: Negative (08/09 0000)  HIV: Non-reactive (08/09 0000)  GBS:--/Positive (02/26 1330) 2 hr Glucola  abnormal Genetic screening AFP neg; declined further testing Anatomy US normal  Prenatal Transfer Tool  Maternal Diabetes: Yes:  Diabetes Type:  Diet controlled Genetic Screening: Only AFP done and WNL Maternal Ultrasounds/Referrals: Normal Fetal Ultrasounds or other Referrals:  None Maternal Substance Abuse:  No Significant Maternal Medications:  None Significant Maternal Lab Results: Group B Strep positive  Past Medical History:  Diagnosis Date  . Chlamydia   . Eczema   . Superficial acne vulgaris     Past Surgical History:  Procedure Laterality Date  . NO PAST SURGERIES    . none      OB History  Gravida Para Term Preterm AB Living  1            SAB TAB Ectopic Multiple Live Births               # Outcome Date GA Lbr Len/2nd Weight Sex Delivery Anes PTL Lv  1  Current             Social History   Socioeconomic History  . Marital status: Single    Spouse name: Jacquelynn Cree  . Number of children: Not on file  . Years of education: Not on file  . Highest education level: Not on file  Occupational History  . Not on file  Tobacco Use  . Smoking status: Never Smoker  . Smokeless tobacco: Never Used  Substance and Sexual Activity  . Alcohol use: Not Currently    Comment: socially only   . Drug use: No  . Sexual activity: Yes    Partners: Male    Birth control/protection: Condom  Other Topics Concern  . Not on file  Social History Narrative  . Not on file   Social Determinants of Health   Financial Resource Strain:   . Difficulty of Paying Living Expenses:   Food Insecurity:   . Worried About Programme researcher, broadcasting/film/video in the Last Year:   . Barista in the Last Year:   Transportation Needs:   . Freight forwarder (Medical):   Marland Kitchen Lack of Transportation (Non-Medical):   Physical Activity:   . Days of Exercise per Week:   . Minutes of Exercise per Session:   Stress:   . Feeling of Stress :   Social Connections:   . Frequency of Communication with Friends and Family:   . Frequency of Social Gatherings with Friends and Family:   .  Attends Religious Services:   . Active Member of Clubs or Organizations:   . Attends Banker Meetings:   Marland Kitchen Marital Status:     Family History  Problem Relation Age of Onset  . Healthy Mother   . Healthy Father   . Hypertension Maternal Grandmother     Medications Prior to Admission  Medication Sig Dispense Refill Last Dose  . cetirizine (ZYRTEC) 10 MG tablet Take 10 mg by mouth as needed.    Past Week at Unknown time  . ferrous sulfate 325 (65 FE) MG tablet Take 325 mg by mouth daily with breakfast.   Past Week at Unknown time  . hydrocortisone valerate ointment (WESTCORT) 0.2 % Apply 1 application topically 2 (two) times daily. 45 g 0 05/12/2019 at Unknown time  . Prenatal  Vit-Fe Fumarate-FA (PRENATAL MULTIVITAMIN) TABS tablet Take 1 tablet by mouth daily at 12 noon.   Past Week at Unknown time  . Accu-Chek FastClix Lancets MISC USE TO CHECK BLOOD SUGAR 3 TO 4 TIMES PER DAY     . ACCU-CHEK GUIDE test strip USE TO CHECK BLOOD SUGAR THREE TIMES DAILY TO FOUR TIMES DAILY     . Blood Pressure Monitor MISC For regular home bp monitoring during pregnancy 1 each 0   . triamcinolone cream (KENALOG) 0.1 % Apply 1 application topically 2 (two) times daily as needed (For eczema.).   Unknown at Unknown time    No Known Allergies  Review of Systems: Pertinent items noted in HPI and remainder of comprehensive ROS otherwise negative.  Physical Exam: LMP 08/12/2018  FHR by Doppler: 134 bpm CONSTITUTIONAL: Well-developed, well-nourished female in no acute distress.  HENT:  Normocephalic, atraumatic, External right and left ear normal.  EYES: Conjunctivae and EOM are normal. No scleral icterus.  NECK: Normal range of motion, supple, no masses SKIN: Skin is warm and dry. No rash noted. Not diaphoretic. No erythema. No pallor. NEUROLGIC: Alert and oriented to person, place, and time. Normal reflexes, muscle tone coordination. No cranial nerve deficit noted. PSYCHIATRIC: Normal mood and affect. Normal behavior. Normal judgment and thought content. CARDIOVASCULAR: Normal heart rate noted RESPIRATORY: Effort and breath sounds normal, no problems with respiration noted ABDOMEN: Soft, nontender, nondistended, gravid.  PELVIC: Deferred MUSCULOSKELETAL: Normal range of motion. No edema and no tenderness. 2+ distal pulses.   Pertinent Labs/Studies:   Results for orders placed or performed during the hospital encounter of 05/11/19 (from the past 72 hour(s))  SARS CORONAVIRUS 2 (TAT 6-24 HRS) Nasopharyngeal Nasopharyngeal Swab     Status: None   Collection Time: 05/11/19  9:18 AM   Specimen: Nasopharyngeal Swab  Result Value Ref Range   SARS Coronavirus 2 NEGATIVE NEGATIVE     Comment: (NOTE) SARS-CoV-2 target nucleic acids are NOT DETECTED. The SARS-CoV-2 RNA is generally detectable in upper and lower respiratory specimens during the acute phase of infection. Negative results do not preclude SARS-CoV-2 infection, do not rule out co-infections with other pathogens, and should not be used as the sole basis for treatment or other patient management decisions. Negative results must be combined with clinical observations, patient history, and epidemiological information. The expected result is Negative. Fact Sheet for Patients: HairSlick.no Fact Sheet for Healthcare Providers: quierodirigir.com This test is not yet approved or cleared by the [place] FDA and  has been authorized for detection and/or diagnosis of SARS-CoV-2 by FDA under an Emergency Use Authorization (EUA). This EUA will remain  in effect (meaning this test can be used) for  the duration of the COVID-19 declaration under Section 56 4(b)(1) of the Act, 21 U.S.C. section 360bbb-3(b)(1), unless the authorization is terminated or revoked sooner. Performed at [facility], [place] [address] [place], [place] [address]   CBC     Status: Abnormal   Collection Time: 05/11/19  9:27 AM  Result Value Ref Range   WBC 5.1 4.0 - 10.5 K/uL   RBC 4.87 3.87 - 5.11 MIL/uL   Hemoglobin 12.5 12.0 - 15.0 g/dL   HCT 39.4 36.0 - 46.0 %   MCV 80.9 80.0 - 100.0 fL   MCH 25.7 (L) 26.0 - 34.0 pg   MCHC 31.7 30.0 - 36.0 g/dL   RDW 16.5 (H) 11.5 - 15.5 %   Platelets 208 150 - 400 K/uL   nRBC 0.0 0.0 - 0.2 %    Comment: Performed at [facility], [place] [address], [place], [place] [address]  RPR     Status: None   Collection Time: 05/11/19  9:27 AM  Result Value Ref Range   RPR Ser Ql NON REACTIVE NON REACTIVE    Comment: Performed at [facility], [place] [address], [place], [place] [address]  Type and screen     Status: None   Collection Time:  05/11/19  9:27 AM  Result Value Ref Range   ABO/RH(D) O POS    Antibody Screen NEG    Sample Expiration      05/14/2019,2359 Performed at [facility], [place] [address] [place], [place] [address]   ABO/Rh     Status: None   Collection Time: 05/11/19  9:27 AM  Result Value Ref Range   ABO/RH(D)      O POS Performed at [place] [address] [place],  [address]     Assessment and Plan: Miranda Potts is a 29 y.o. G1P0 at [redacted]w[redacted]d being admitted for scheduled cesarean section due to breech presentation. The risks of cesarean section discussed with the patient included but were not limited to: bleeding which may require transfusion or reoperation; infection which may require antibiotics; injury to bowel, bladder, ureters or other surrounding organs; injury to the fetus; need for additional procedures including hysterectomy in the event of a life-threatening hemorrhage; placental abnormalities with subsequent pregnancies, incisional problems, thromboembolic phenomenon and other postoperative/anesthesia complications. The patient concurred with the proposed plan, giving informed written consent for the procedure. Patient has been NPO since last night she will remain NPO for procedure. Anesthesia and OR aware. Preoperative prophylactic antibiotics and SCDs ordered on call to the OR. To OR when ready.   Pregnancy Complications:  - GDMA1: will obtain fasting glucose post-partum and GTT outpatient - established care at 32 weeks Contraception: Declines Circumcision: outpatient   Barrington Ellison, MD [facility] Family Medicine Fellow, [facility] for [facility], [place]

## 2019-05-13 NOTE — Anesthesia Procedure Notes (Signed)
Spinal  Patient location during procedure: OR Start time: 05/13/2019 10:00 AM End time: 05/13/2019 10:15 AM Staffing Performed: anesthesiologist  Anesthesiologist: Leonides Grills, MD Preanesthetic Checklist Completed: patient identified, IV checked, risks and benefits discussed, surgical consent, monitors and equipment checked, pre-op evaluation and timeout performed Spinal Block Patient position: sitting Prep: DuraPrep Patient monitoring: cardiac monitor, continuous pulse ox and blood pressure Approach: midline Location: L4-5 Injection technique: single-shot Needle Needle type: Pencan  Needle gauge: 25 G Needle length: 12.7 cm Assessment Sensory level: T10 Additional Notes Functioning IV was confirmed and monitors were applied. Sterile prep and drape, including hand hygiene and sterile gloves were used. The patient was positioned and the spine was prepped. The skin was anesthetized with lidocaine.  Unable to obtain CSF despite numerous attempts with 24 gauge Pencan. 17 gauge Espocan Tuohy needle used to obtain a loss of resistance. Then Pencan placed thru the Espocan. Free flow of clear CSF was obtained prior to injecting local anesthetic into the CSF.  The spinal needle aspirated freely following injection.  The needle was carefully withdrawn.  The patient tolerated the procedure well.

## 2019-05-13 NOTE — Anesthesia Preprocedure Evaluation (Addendum)
Anesthesia Evaluation  Patient identified by MRN, date of birth, ID band Patient awake    Reviewed: Allergy & Precautions, NPO status , Patient's Chart, lab work & pertinent test results  Airway Mallampati: II  TM Distance: >3 FB Neck ROM: Full    Dental no notable dental hx.    Pulmonary neg pulmonary ROS,    Pulmonary exam normal breath sounds clear to auscultation       Cardiovascular negative cardio ROS Normal cardiovascular exam Rhythm:Regular Rate:Normal     Neuro/Psych negative neurological ROS  negative psych ROS   GI/Hepatic negative GI ROS, Neg liver ROS,   Endo/Other  negative endocrine ROS  Renal/GU negative Renal ROS     Musculoskeletal negative musculoskeletal ROS (+)   Abdominal (+) + obese,   Peds  Hematology negative hematology ROS (+)   Anesthesia Other Findings 39 weeks Breech  Reproductive/Obstetrics (+) Pregnancy                            Anesthesia Physical Anesthesia Plan  ASA: II  Anesthesia Plan: Spinal   Post-op Pain Management:    Induction:   PONV Risk Score and Plan: 2 and Ondansetron, Dexamethasone and Treatment may vary due to age or medical condition  Airway Management Planned: Natural Airway  Additional Equipment:   Intra-op Plan:   Post-operative Plan:   Informed Consent: I have reviewed the patients History and Physical, chart, labs and discussed the procedure including the risks, benefits and alternatives for the proposed anesthesia with the patient or authorized representative who has indicated his/her understanding and acceptance.     Dental advisory given  Plan Discussed with: CRNA  Anesthesia Plan Comments:        Anesthesia Quick Evaluation

## 2019-05-13 NOTE — Discharge Summary (Signed)
Postpartum Discharge Summary      Patient Name: Miranda Potts DOB: [birth year] MRN: [id number]  Date of admission: 05/13/2019 Delivering Provider: Jonnie Kind   Date of discharge: 05/15/2019  Admitting diagnosis: Breech presentation, antepartum [O32.1XX0] Labor and delivery, indication for care [O75.9] Intrauterine pregnancy: [redacted]w[redacted]d    Secondary diagnosis:  Active Problems:   Gestational diabetes mellitus, class A1   Breech presentation   Breech presentation, antepartum   Labor and delivery, indication for care  Additional problems: None     Discharge diagnosis: Term Pregnancy Delivered and GDM A1                                                                                                Post partum procedures:None  Augmentation: NA  Complications: None  Hospital course:  Sceduled C/S   29y.o. yo G1P1001 at 370w1das admitted to the hospital 05/13/2019 for scheduled cesarean section with the following indication:Malpresentation.  Membrane Rupture Time/Date: 10:31 AM ,05/13/2019   Patient delivered a Viable infant.05/13/2019  Details of operation can be found in separate operative note.  Pateint had an uncomplicated postpartum course. Fasting AM glucose 74. Declined birth control. She is ambulating, tolerating a regular diet, passing flatus, and urinating well. Patient is discharged home in stable condition on  05/15/19        Delivery time: 10:31 AM    Magnesium Sulfate received: No BMZ received: No Rhophylac:No MMR:No Transfusion:No  Physical exam  Vitals:   05/14/19 1217 05/14/19 1630 05/14/19 2000 05/15/19 0504  BP: (!) 105/53 102/71 (!) 104/51 110/71  Pulse: 63 68 64 61  Resp: '18 16 18 18  ' Temp: 97.8 F (36.6 C) 98.1 F (36.7 C) 98 F (36.7 C) 98 F (36.7 C)  TempSrc: Oral Oral Oral Axillary  SpO2: 100% 100% 100% 100%  Weight:      Height:       General: alert, cooperative and no distress Lochia: appropriate Uterine Fundus: firm Incision:  Healing well with no significant drainage, No significant erythema, Dressing is clean, dry, and intact DVT Evaluation: No evidence of DVT seen on physical exam. Labs: Lab Results  Component Value Date   WBC 8.0 05/14/2019   HGB 9.5 (L) 05/14/2019   HCT 30.3 (L) 05/14/2019   MCV 81.9 05/14/2019   PLT 168 05/14/2019   No flowsheet data found. Edinburgh Score: No flowsheet data found.  Discharge instruction: per After Visit Summary and "Baby and Me Booklet".  After visit meds:  Allergies as of 05/15/2019   No Known Allergies     Medication List    STOP taking these medications   Accu-Chek FastClix Lancets Misc   Accu-Chek Guide test strip Generic drug: glucose blood     TAKE these medications   acetaminophen 325 MG tablet Commonly known as: TYLENOL Take 2 tablets (650 mg total) by mouth every 6 (six) hours as needed for mild pain (temperature > 101.5.).   Blood Pressure Monitor Misc For regular home bp monitoring during pregnancy   cetirizine 10 MG tablet Commonly known as: ZYRTEC Take 10 mg by mouth  as needed.   ferrous sulfate 325 (65 FE) MG tablet Take 1 tablet (325 mg total) by mouth every other day. Start taking on: May 16, 2019 What changed: when to take this   hydrocortisone valerate ointment 0.2 % Commonly known as: Westcort Apply 1 application topically 2 (two) times daily.   ibuprofen 800 MG tablet Commonly known as: ADVIL Take 1 tablet (800 mg total) by mouth every 6 (six) hours.   oxyCODONE 5 MG immediate release tablet Commonly known as: Oxy IR/ROXICODONE Take 1-2 tablets (5-10 mg total) by mouth every 4 (four) hours as needed for moderate pain.   prenatal multivitamin Tabs tablet Take 1 tablet by mouth daily at 12 noon.   senna-docusate 8.6-50 MG tablet Commonly known as: Senokot-S Take 2 tablets by mouth daily. Start taking on: May 16, 2019   triamcinolone cream 0.1 % Commonly known as: KENALOG Apply 1 application topically 2 (two)  times daily as needed (For eczema.).       Diet: routine diet  Activity: Advance as tolerated. Pelvic rest for 6 weeks.   Outpatient follow up:4 weeks Follow up Appt: Future Appointments  Date Time Provider [place]  05/23/2019  2:50 PM Jonnie Kind, MD [facility]   Follow up Visit:    Please schedule this patient for Postpartum visit in: 4 weeks with the following provider: Any provider In-Person For C/S patients schedule nurse incision check in weeks 2 weeks: yes High risk pregnancy complicated by: GDM Delivery mode:  CS Anticipated Birth Control:  other/unsure PP Procedures needed: GTT and incision check  Schedule Integrated BH visit: no     Newborn Data: Live born female  Birth Weight: 3965g  APGAR: 93, 9  Newborn Delivery   Birth date/time: 05/13/2019 10:31:00 Delivery type: C-Section, Low Transverse Trial of labor: No C-section categorization: Primary      Baby Feeding: Bottle Disposition:home with mother   05/15/2019 Chauncey Mann, MD

## 2019-05-13 NOTE — Op Note (Signed)
Miranda Lamagna PROCEDURE DATE: 05/13/2019  PREOPERATIVE DIAGNOSES: Intrauterine pregnancy at [redacted]w[redacted]d weeks gestation; malpresentation: frank breech  POSTOPERATIVE DIAGNOSES: The same  PROCEDURE: Primary Low Transverse Cesarean Section  SURGEON:  Dr. Emelda Fear - Primary Dr. Jerilynn Birkenhead - Fellow  ANESTHESIOLOGY TEAM: Anesthesiologist: Leonides Grills, MD CRNA: Armanda Heritage, CRNA  INDICATIONS: Miranda Potts is a 29 y.o. G1P1001 at [redacted]w[redacted]d here for cesarean section secondary to the indications listed under preoperative diagnoses; please see preoperative note for further details.  The risks of cesarean section were discussed with the patient including but were not limited to: bleeding which may require transfusion or reoperation; infection which may require antibiotics; injury to bowel, bladder, ureters or other surrounding organs; injury to the fetus; need for additional procedures including hysterectomy in the event of a life-threatening hemorrhage; placental abnormalities wth subsequent pregnancies, incisional problems, thromboembolic phenomenon and other postoperative/anesthesia complications.   The patient concurred with the proposed plan, giving informed written consent for the procedure.    FINDINGS:  Viable female infant in breech presentation. Clear amniotic fluid.  Intact placenta, three vessel cord.  Normal uterus, fallopian tubes and ovaries bilaterally. APGAR (1 MIN): 9   APGAR (5 MINS): 9   APGAR (10 MINS):    ANESTHESIA: CSE  INTRAVENOUS FLUIDS: 1600 ml   ESTIMATED BLOOD LOSS: 652 ml URINE OUTPUT:  150 ml SPECIMENS: Placenta sent to L&D COMPLICATIONS: None immediate  PROCEDURE IN DETAIL:  The patient preoperatively received intravenous antibiotics and had sequential compression devices applied to her lower extremities.  She was then taken to the operating room where spinal anesthesia was administered and was found to be adequate. She was then placed in a dorsal  supine position with a leftward tilt, and prepped and draped in a sterile manner.  A foley catheter was placed into her bladder and attached to constant gravity.  After an adequate timeout was performed, a Pfannenstiel skin incision was made with scalpel and carried through to the underlying layer of fascia. The fascia was incised in the midline, and this incision was extended bilaterally using the Mayo scissors.  Kocher clamps were applied to the superior aspect of the fascial incision and the underlying rectus muscles were dissected off bluntly and sharply.  A similar process was carried out on the inferior aspect of the fascial incision. The rectus muscles were separated in the midline and the peritoneum was entered bluntly. The Alexis self-retaining retractor was introduced into the abdominal cavity.  Attention was turned to the lower uterine segment where a low transverse hysterotomy was made with a scalpel and extended bilaterally bluntly.  The infant was successfully delivered from frank breech position, the cord was clamped and cut after one minute, and the infant was handed over to the awaiting neonatology team. Uterine massage was then administered, and the placenta delivered intact with a three-vessel cord. The uterus was then cleared of clots and debris.  The hysterotomy was closed with 0 Monocryl in a running locked fashion, and an imbricating layer was also placed with 0 Monocryl.  Figure-of-eight 0 Monocryl serosal stitch placed to help with hemostasis.  The pelvis was cleared of all clot and debris. Hemostasis was confirmed on all surfaces.  The retractor was removed.  The peritoneum was closed with a 2-0 Vicryl running stitch. The fascia was then closed using 0 Vicryl in a running fashion.  The subcutaneous layer was irrigated, reapproximated with 2-0 plain gut interrupted stitches, and the skin was closed with a 4-0 Vicryl subcuticular stitch.  The patient tolerated the procedure well. Sponge,  instrument and needle counts were correct x 3.  She was taken to the recovery room in stable condition.   Barrington Ellison, MD [facility] Family Medicine Fellow, [facility] for [employer], [place]

## 2019-05-14 LAB — CBC
HCT: 30.3 % — ABNORMAL LOW (ref 36.0–46.0)
Hemoglobin: 9.5 g/dL — ABNORMAL LOW (ref 12.0–15.0)
MCH: 25.7 pg — ABNORMAL LOW (ref 26.0–34.0)
MCHC: 31.4 g/dL (ref 30.0–36.0)
MCV: 81.9 fL (ref 80.0–100.0)
Platelets: 168 10*3/uL (ref 150–400)
RBC: 3.7 MIL/uL — ABNORMAL LOW (ref 3.87–5.11)
RDW: 16.7 % — ABNORMAL HIGH (ref 11.5–15.5)
WBC: 8 10*3/uL (ref 4.0–10.5)
nRBC: 0 % (ref 0.0–0.2)

## 2019-05-14 MED ORDER — FERROUS SULFATE 325 (65 FE) MG PO TABS
325.0000 mg | ORAL_TABLET | ORAL | Status: DC
  Administered 2019-05-14: 325 mg via ORAL
  Filled 2019-05-14 (×2): qty 1

## 2019-05-14 NOTE — Progress Notes (Addendum)
Post Partum Day 1 Subjective: no complaints, up ad lib and tolerating PO. No voiding or passing gas yet  Objective: Blood pressure 108/61, pulse 60, temperature 97.8 F (36.6 C), resp. rate 18, height 5\' 4"  (1.626 m), weight 82.6 kg, last menstrual period 08/12/2018, SpO2 99 %, unknown if currently breastfeeding.  Physical Exam:  General: alert, cooperative and no distress Lochia: appropriate Uterine Fundus: firm Incision: dry and intact DVT Evaluation: No evidence of DVT seen on physical exam.  Recent Labs    05/11/19 0927  HGB 12.5  HCT 39.4    Assessment/Plan: Patient requesting to go home today, but likely plan for discharge tomorrow    LOS: 1 day   07/11/19 05/14/2019, 5:37 AM   I saw and evaluated the patient. I agree with the findings and the plan of care as documented in the student's note. Bottle feeding. Declines birth control. Vitals stable. AM CBC pending.  05/16/2019, MD [facility] Family Medicine Fellow, [facility] for [facility]

## 2019-05-15 ENCOUNTER — Encounter (HOSPITAL_COMMUNITY): Payer: Self-pay | Admitting: Obstetrics and Gynecology

## 2019-05-15 MED ORDER — IBUPROFEN 800 MG PO TABS: 800.0000 mg | ORAL_TABLET | Freq: Four times a day (QID) | ORAL | 0 refills | Status: DC

## 2019-05-15 MED ORDER — OXYCODONE HCL 5 MG PO TABS: 5.0000 mg | ORAL_TABLET | ORAL | 0 refills | Status: DC | PRN

## 2019-05-15 MED ORDER — ACETAMINOPHEN 325 MG PO TABS: 650.0000 mg | ORAL_TABLET | Freq: Four times a day (QID) | ORAL | 0 refills | Status: DC | PRN

## 2019-05-15 MED ORDER — SENNOSIDES-DOCUSATE SODIUM 8.6-50 MG PO TABS: 2.0000 | ORAL_TABLET | ORAL | 0 refills | Status: DC

## 2019-05-15 MED ORDER — FERROUS SULFATE 325 (65 FE) MG PO TABS: 325.0000 mg | ORAL_TABLET | ORAL | 0 refills | Status: DC

## 2019-05-23 ENCOUNTER — Other Ambulatory Visit: Payer: Self-pay

## 2019-05-23 ENCOUNTER — Encounter: Payer: Self-pay | Admitting: Obstetrics and Gynecology

## 2019-05-23 ENCOUNTER — Ambulatory Visit (INDEPENDENT_AMBULATORY_CARE_PROVIDER_SITE_OTHER): Payer: 59 | Admitting: Obstetrics and Gynecology

## 2019-05-23 DIAGNOSIS — Z4889 Encounter for other specified surgical aftercare: Secondary | ICD-10-CM

## 2019-05-23 NOTE — Progress Notes (Signed)
No chief complaint on file.   Subjective:  Miranda Potts is a 29 y.o. female now 1 weeks status post cesarean section.   She reports some pain and tenderness at the incision site. She notes that the pain medicine that she was given post op makes her nauseous.  (Oxycodone) will reduce to tramadol  At this time, she is utilizing condoms as her primary contraceptive. I briefly discussed potential birth control options with her. She declines fever.   Review of Systems Negative except for some pain and tenderness at the incision site.    Diet:   normal   Bowel movements : normal.  Pain is controlled with current analgesics. Medications being used: Switch to tramadol.  Objective:  There were no vitals taken for this visit. General:Well developed, well nourished.  No acute distress. Abdomen: Bowel sounds normal, soft, non-tender. Pelvic Exam:    Not required Incision(s): Healing well; no drainage, erythema, hernia, swelling, or dehissence dressing removed Steri-Strips left in place     Assessment:  Post-Op 1 weeks s/p cesarean section   I have encouraged her to consider a birth control as she is not breastfeeding   Plan:  1.Wound care discussed may shower, may drive after 1 week further 2. . current medications. 3. Activity restrictions: no driving 4. return to work: not applicable. 5. Follow up in 4 weeks.  This chart was scribed by Mal Misty, Medical Scribe, for Dr. Christin Bach on 05/23/19 at 3:37 PM. This chart was reviewed by Dr. Christin Bach for accuracy.

## 2019-05-25 ENCOUNTER — Other Ambulatory Visit: Payer: Self-pay | Admitting: Family Medicine

## 2019-05-26 ENCOUNTER — Other Ambulatory Visit: Payer: Self-pay | Admitting: Obstetrics & Gynecology

## 2019-05-26 MED ORDER — IBUPROFEN 800 MG PO TABS: 800.0000 mg | ORAL_TABLET | Freq: Four times a day (QID) | ORAL | 2 refills | Status: DC

## 2019-06-19 ENCOUNTER — Encounter: Payer: Self-pay | Admitting: Women's Health

## 2019-06-19 ENCOUNTER — Other Ambulatory Visit: Payer: Self-pay

## 2019-06-19 ENCOUNTER — Ambulatory Visit (INDEPENDENT_AMBULATORY_CARE_PROVIDER_SITE_OTHER): Payer: 59 | Admitting: Women's Health

## 2019-06-19 DIAGNOSIS — L709 Acne, unspecified: Secondary | ICD-10-CM

## 2019-06-19 DIAGNOSIS — Z8632 Personal history of gestational diabetes: Secondary | ICD-10-CM

## 2019-06-19 MED ORDER — BENZOYL PEROXIDE-ERYTHROMYCIN 5-3 % EX GEL: Freq: Two times a day (BID) | CUTANEOUS | 0 refills | Status: DC

## 2019-06-19 NOTE — Progress Notes (Signed)
POSTPARTUM VISIT Patient name: Miranda Potts MRN [id number]  Date of birth: 12/27/1990 Chief Complaint:   Postpartum Care  History of Present Illness:   Miranda Kestner is a 29 y.o. G13P1001 African American female being seen today for a postpartum visit. She is 5 weeks postpartum following a primary cesarean section, low transverse incision d/t breech at 39.1 gestational weeks. Anesthesia: spinal. Laceration: none. I have fully reviewed the prenatal and intrapartum course. Pregnancy complicated by A1DM. Postpartum course has been uncomplicated. Requests acne medicine, got worse after baby. Bleeding no bleeding. Bowel function is normal. Bladder function is normal.  Patient is not sexually active. Last sexual activity: prior to birth of baby.  Contraception method is condoms.    Last pap 11/01/18.  Results were abnormal  ASCUS w/ +HRHPV .  No LMP recorded.  Baby's course has been uncomplicated. Baby is feeding by bottle    Edinburgh Postpartum Depression Screening: negative Edinburgh Postnatal Depression Scale - 06/19/19 1208      Edinburgh Postnatal Depression Scale:  In the Past 7 Days   I have been able to laugh and see the funny side of things.  0    I have looked forward with enjoyment to things.  0    I have blamed myself unnecessarily when things went wrong.  0    I have been anxious or worried for no good reason.  0    I have felt scared or panicky for no good reason.  0    Things have been getting on top of me.  0    I have been so unhappy that I have had difficulty sleeping.  0    I have felt sad or miserable.  0    I have been so unhappy that I have been crying.  0    The thought of harming myself has occurred to me.  0    Edinburgh Postnatal Depression Scale Total  0      Review of Systems:   Pertinent items are noted in HPI Denies Abnormal vaginal discharge w/ itching/odor/irritation, headaches, visual changes, shortness of breath, chest pain, abdominal pain,  severe nausea/vomiting, or problems with urination or bowel movements. Pertinent History Reviewed:  Reviewed past medical,surgical, obstetrical and family history.  Reviewed problem list, medications and allergies. OB History  Gravida Para Term Preterm AB Living  1 1 1     1   SAB TAB Ectopic Multiple Live Births        0 1    # Outcome Date GA Lbr Len/2nd Weight Sex Delivery Anes PTL Lv  1 Term 05/13/19 [redacted]w[redacted]d  8 lb 11.9 oz (3.965 kg) M CS-LTranv Spinal  LIV   Physical Assessment:   Vitals:   06/19/19 1208  BP: 124/71  Pulse: (!) 56  Weight: 155 lb 9.6 oz (70.6 kg)  Height: 5\' 4"  (1.626 m)  Body mass index is 26.71 kg/m.       Physical Examination:   General appearance: alert, well appearing, and in no distress  Mental status: alert, oriented to person, place, and time  Skin: warm & dry, +facial acne   Cardiovascular: normal heart rate noted   Respiratory: normal respiratory effort, no distress   Breasts: deferred, no complaints   Abdomen: soft, non-tender, c/s incision well healed  Pelvic: examination not indicated  Rectal: not examined   Extremities: no edema       No results found for this or any previous visit (from the past 24 hour(s)).  Assessment & Plan:  1) Postpartum exam 2) 5 wks s/p PCS for breech 3) Bottlefeeding 4) Depression screening 5) Contraception counseling, pt prefers condoms  6) Facial acne> rx benzamycin gel 7) A1DM during pregnancy> f/u 4wks for 2hr GTT 8) H/O abnormal pap> repeat after 9/1  Meds:  Meds ordered this encounter  Medications  . benzoyl peroxide-erythromycin (BENZAMYCIN) gel    Sig: Apply topically 2 (two) times daily.    Dispense:  23.3 g    Refill:  0    Order Specific Question:   Supervising Provider    Answer:   Florian Buff [[id number]]    Follow-up: Return in about 4 weeks (around 07/17/2019) for 2hr sugar test (no visit)- then after 9/1 for pap & physical.   No orders of the defined types were placed in this  encounter.   [place], [facility] 06/19/2019 12:46 PM

## 2019-06-19 NOTE — Patient Instructions (Signed)
You will have your sugar test next visit.  Please do not eat or drink anything after midnight the night before you come, not even water.  You will be here for at least two hours.  Please make an appointment online for the bloodwork at [email or web address] for 8:30am (or as close to this as possible). Make sure you select the [address] service center. The day of the appointment, check in with our office first, then you will go to [facility] to start the sugar test.   

## 2019-07-04 ENCOUNTER — Other Ambulatory Visit: Payer: 59

## 2019-07-11 ENCOUNTER — Other Ambulatory Visit: Payer: Self-pay

## 2019-07-11 ENCOUNTER — Encounter (INDEPENDENT_AMBULATORY_CARE_PROVIDER_SITE_OTHER): Payer: Self-pay | Admitting: Internal Medicine

## 2019-07-11 ENCOUNTER — Ambulatory Visit (INDEPENDENT_AMBULATORY_CARE_PROVIDER_SITE_OTHER): Payer: 59 | Admitting: Internal Medicine

## 2019-07-11 VITALS — BP 118/76 | HR 57 | Temp 98.4°F | Resp 18 | Ht 64.0 in | Wt 151.0 lb

## 2019-07-11 DIAGNOSIS — L308 Other specified dermatitis: Secondary | ICD-10-CM

## 2019-07-11 DIAGNOSIS — R21 Rash and other nonspecific skin eruption: Secondary | ICD-10-CM

## 2019-07-11 MED ORDER — PREDNISONE 20 MG PO TABS: 40.0000 mg | ORAL_TABLET | Freq: Every day | ORAL | 1 refills | Status: DC

## 2019-07-11 MED ORDER — TRIAMCINOLONE ACETONIDE 0.1 % EX CREA: 1.0000 "application " | TOPICAL_CREAM | Freq: Two times a day (BID) | CUTANEOUS | 1 refills | Status: AC | PRN

## 2019-07-11 NOTE — Progress Notes (Signed)
Metrics: Intervention Frequency ACO  Documented Smoking Status Yearly  Screened one or more times in 24 months  Cessation Counseling or  Active cessation medication Past 24 months  Past 24 months   Guideline developer: UpToDate (See UpToDate for funding source) Date Released: 2014       Wellness Office Visit  Subjective:  Patient ID: [place] Clinger, female    DOB: Jun 14, 1990  Age: 29 y.o. MRN: [id number]  CC: This 29 year old lady comes to the office after a long hiatus.  She has had a PEG pregnancy and now she is about 2 months postpartum. HPI  She is doing well and so is her newborn son.  She is complaining of a skin rash post application of cream that was given to her by OB/GYN.  She wonders if she is for an allergic response.  Also, she needs a refill of her triamcinolone cream for her eczema. Past Medical History:  Diagnosis Date  . Chlamydia   . Eczema   . Superficial acne vulgaris       Family History  Problem Relation Age of Onset  . Healthy Mother   . Healthy Father   . Hypertension Maternal Grandmother     Social History   Social History Narrative   Single,lives with newborn.Works for [employer] ,Health visitor.   Social History   Tobacco Use  . Smoking status: Never Smoker  . Smokeless tobacco: Never Used  Substance Use Topics  . Alcohol use: Not Currently    Comment: socially only     Current Meds  Medication Sig  . Blood Pressure Monitor MISC For regular home bp monitoring during pregnancy  . cetirizine (ZYRTEC) 10 MG tablet Take 10 mg by mouth as needed.   . ferrous sulfate 325 (65 FE) MG tablet Take 1 tablet (325 mg total) by mouth every other day.  . hydrocortisone valerate ointment (WESTCORT) 0.2 % Apply 1 application topically 2 (two) times daily.  Marland Kitchen ibuprofen (ADVIL) 800 MG tablet Take 1 tablet (800 mg total) by mouth every 6 (six) hours.  . Prenatal Vit-Fe Fumarate-FA (PRENATAL MULTIVITAMIN) TABS tablet Take 1 tablet by mouth daily at 12  noon.  . senna-docusate (SENOKOT-S) 8.6-50 MG tablet Take 2 tablets by mouth daily.  Marland Kitchen triamcinolone cream (KENALOG) 0.1 % Apply 1 application topically 2 (two) times daily as needed (For eczema.).  . [DISCONTINUED] acetaminophen (TYLENOL) 325 MG tablet Take 2 tablets (650 mg total) by mouth every 6 (six) hours as needed for mild pain (temperature > 101.5.).  . [DISCONTINUED] benzoyl peroxide-erythromycin (BENZAMYCIN) gel Apply topically 2 (two) times daily.  . [DISCONTINUED] triamcinolone cream (KENALOG) 0.1 % Apply 1 application topically 2 (two) times daily as needed (For eczema.).       Objective:   Today's Vitals: BP 118/76 (BP Location: Right Arm, Patient Position: Sitting, Cuff Size: Small)   Pulse (!) 57   Temp 98.4 F (36.9 C) (Temporal)   Resp 18   Ht 5\' 4"  (1.626 m)   Wt 151 lb (68.5 kg)   SpO2 98%   BMI 25.92 kg/m  Vitals with BMI 07/11/2019 06/19/2019 05/23/2019  Height 5\' 4"  5\' 4"  -  Weight 151 lbs 155 lbs 10 oz 160 lbs 3 oz  BMI 25.91 26.7 27.48  Systolic 118 124 05/25/2019  Diastolic 76 71 78  Pulse 57 56 86     Physical Exam  She looks systemically well.  Her face has discrete lesions, I am not sure what these are exactly, look acne  type in nature.     Assessment   1. Skin rash   2. Other eczema       Tests ordered No orders of the defined types were placed in this encounter.    Plan: 1. I am going to treat her empirically with short course of prednisone orally and I have reminded her of possible side effects and how to deal with them for the skin rash. 2. I refilled her triamcinolone cream for eczema. 3. She will come back in about 3 months for an annual physical exam.   Meds ordered this encounter  Medications  . predniSONE (DELTASONE) 20 MG tablet    Sig: Take 2 tablets (40 mg total) by mouth daily with breakfast.    Dispense:  10 tablet    Refill:  1  . triamcinolone cream (KENALOG) 0.1 %    Sig: Apply 1 application topically 2 (two) times  daily as needed (For eczema.).    Dispense:  453.6 g    Refill:  1    Naiomy Watters Luther Parody, MD

## 2019-07-12 ENCOUNTER — Encounter (INDEPENDENT_AMBULATORY_CARE_PROVIDER_SITE_OTHER): Payer: Self-pay

## 2019-07-12 ENCOUNTER — Telehealth (INDEPENDENT_AMBULATORY_CARE_PROVIDER_SITE_OTHER): Payer: Self-pay

## 2019-07-12 NOTE — Telephone Encounter (Signed)
I do not have any good solutions in terms of the cream but I was hoping that the prednisone oral tablets that I gave her would help her face so she should start taking that and see how things are next week. Ultimately, she will need to be referred to dermatology and if she would like to do this now, we can send her.

## 2019-08-03 ENCOUNTER — Telehealth (INDEPENDENT_AMBULATORY_CARE_PROVIDER_SITE_OTHER): Payer: Self-pay

## 2019-08-03 ENCOUNTER — Other Ambulatory Visit (INDEPENDENT_AMBULATORY_CARE_PROVIDER_SITE_OTHER): Payer: Self-pay | Admitting: Internal Medicine

## 2019-08-03 MED ORDER — PREDNISONE 20 MG PO TABS: 40.0000 mg | ORAL_TABLET | Freq: Every day | ORAL | 1 refills | Status: DC

## 2019-08-03 NOTE — Telephone Encounter (Signed)
[place] is asking for a Rx for prednisone she just ate some peanuts and feels like this might be what she is allergic to, please advise?

## 2019-08-03 NOTE — Telephone Encounter (Signed)
[place] is aware

## 2019-08-03 NOTE — Telephone Encounter (Signed)
Please let the patient know that I have sent prednisone to [facility] on [address].  If she does not improve or gets worse, she must go to the urgent care.  She can always make an appointment to see [place] next week if necessary.

## 2019-10-16 ENCOUNTER — Encounter (INDEPENDENT_AMBULATORY_CARE_PROVIDER_SITE_OTHER): Payer: 59 | Admitting: Nurse Practitioner

## 2019-11-02 ENCOUNTER — Other Ambulatory Visit: Payer: 59 | Admitting: Women's Health

## 2019-12-02 IMAGING — DX DG ELBOW COMPLETE 3+V*R*
4 series · 4 of 4 positions shown · non-contrast
Comparison: None.

CLINICAL DATA: Right elbow pain after fall last night.

EXAM:
RIGHT ELBOW - COMPLETE 3+ VIEW

[elbow obl (1 of 3)]
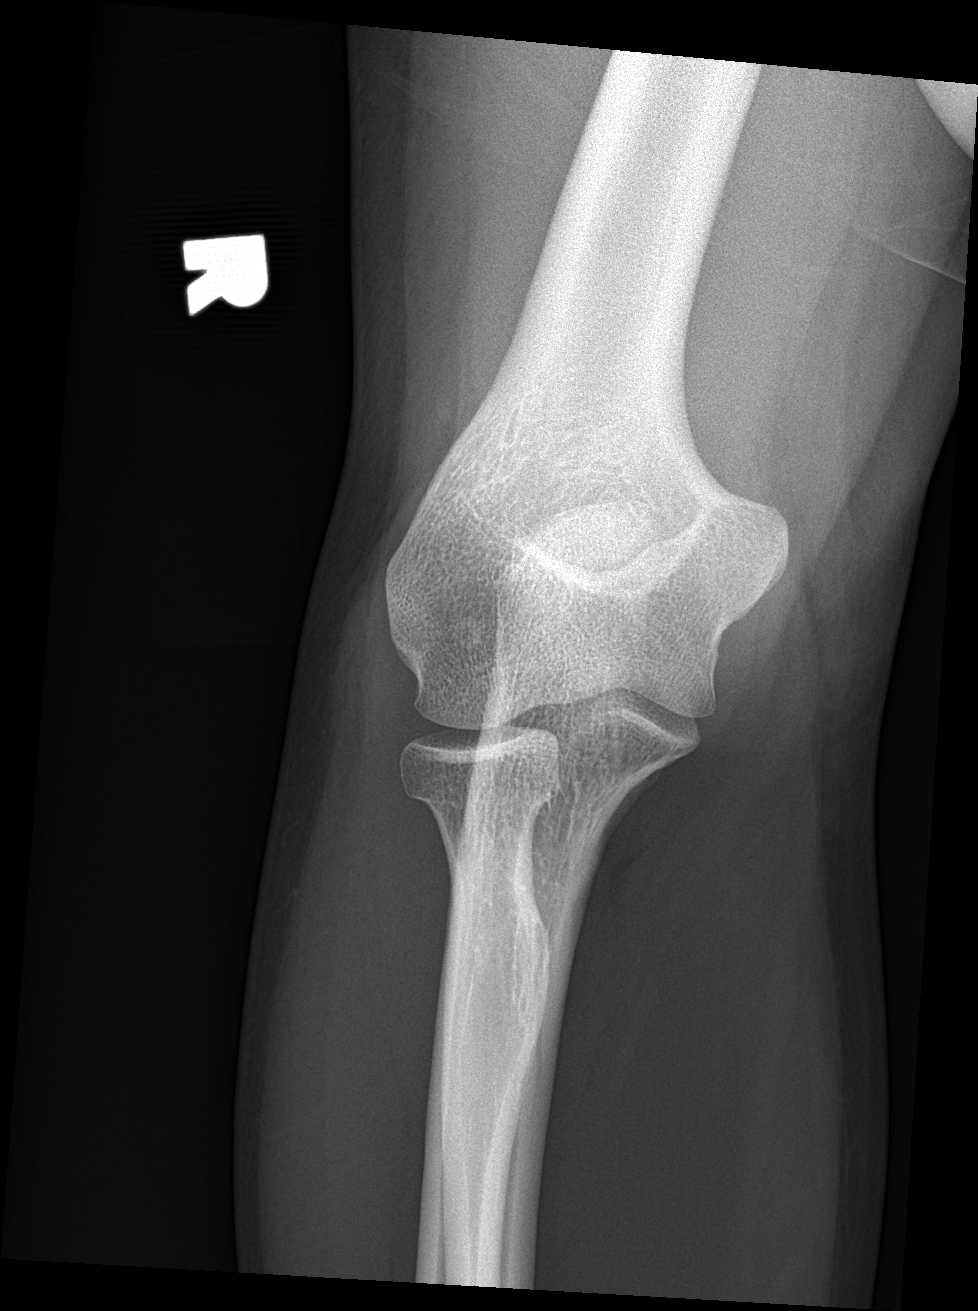

[elbow lat]
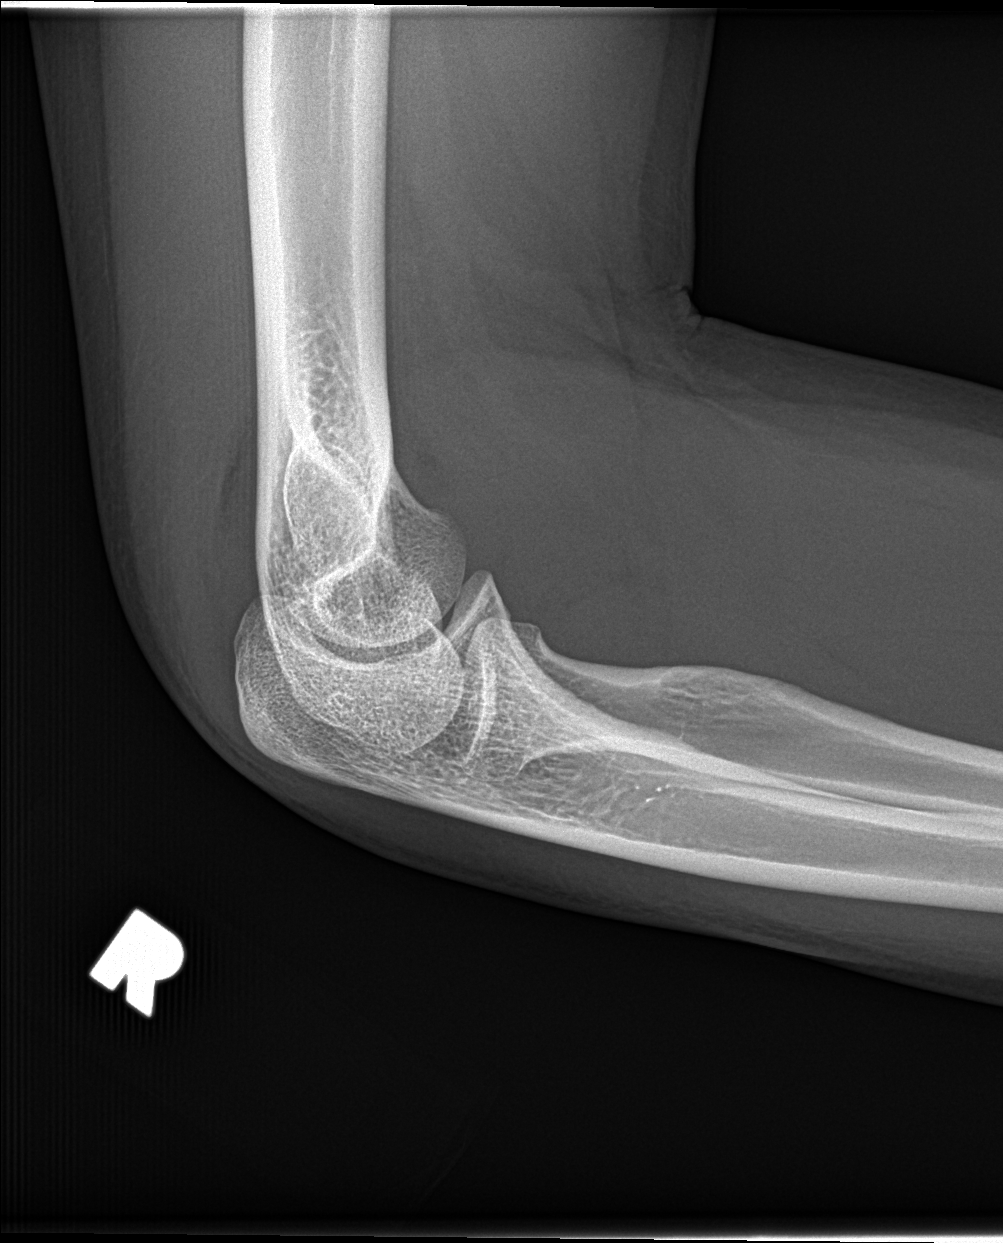

[elbow obl (2 of 3)]
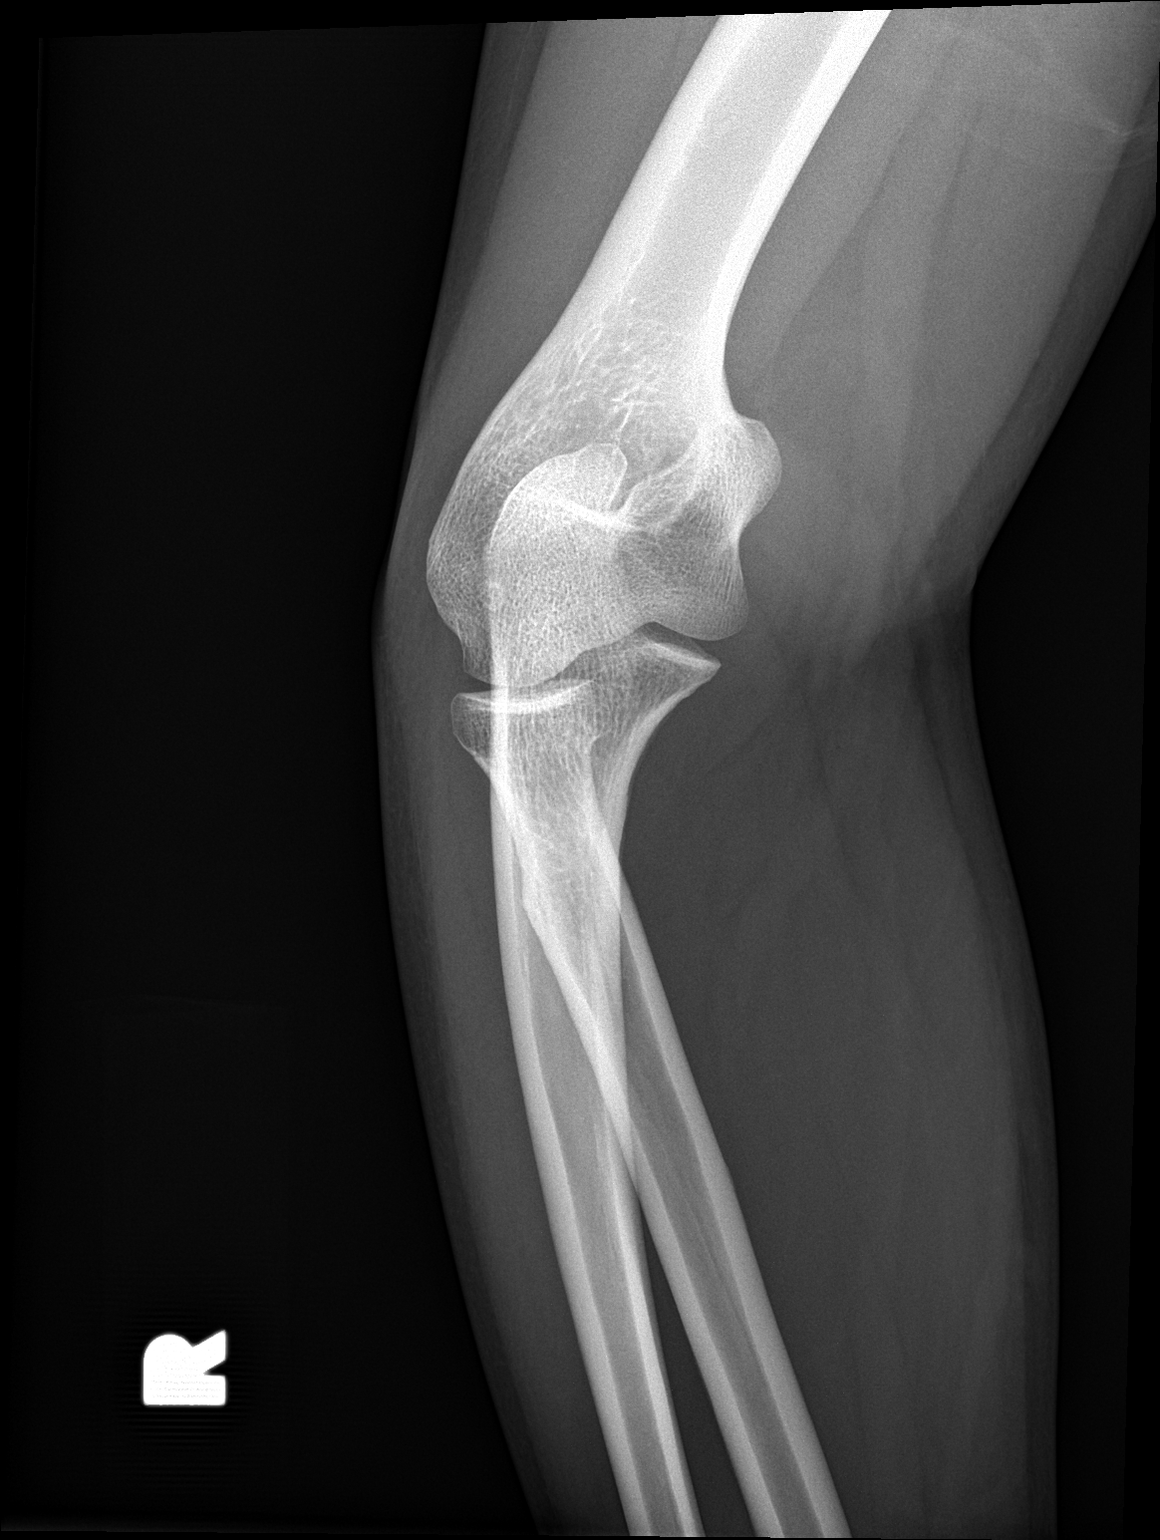

[elbow obl (3 of 3)]
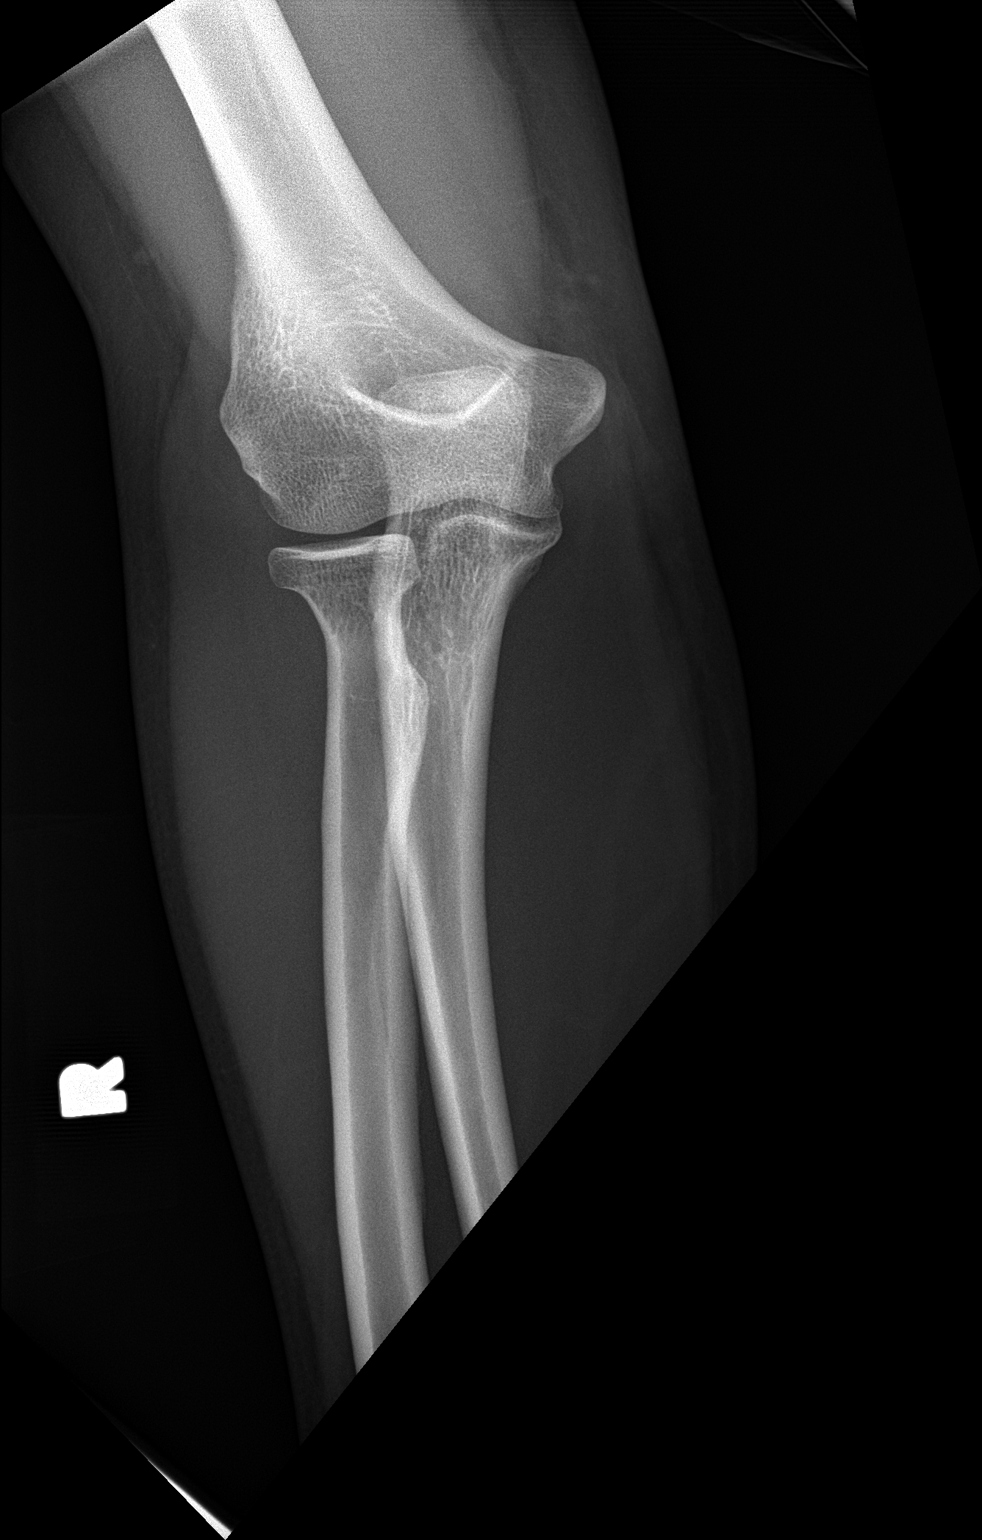

[4 of 4 positions shown; findings below may reference images not displayed]

FINDINGS: There is no evidence of fracture, dislocation, or joint effusion.
There is no evidence of arthropathy or other focal bone abnormality.
Soft tissues are unremarkable.
IMPRESSION: Normal right elbow.

## 2020-02-06 IMAGING — DX DG WRIST COMPLETE 3+V*R*
4 series · 4 of 4 positions shown · non-contrast
Comparison: None.

CLINICAL DATA: jumped into glass door. Laceration to posterior
right wrist.

EXAM:
RIGHT WRIST - COMPLETE 3+ VIEW

[wrist pa]
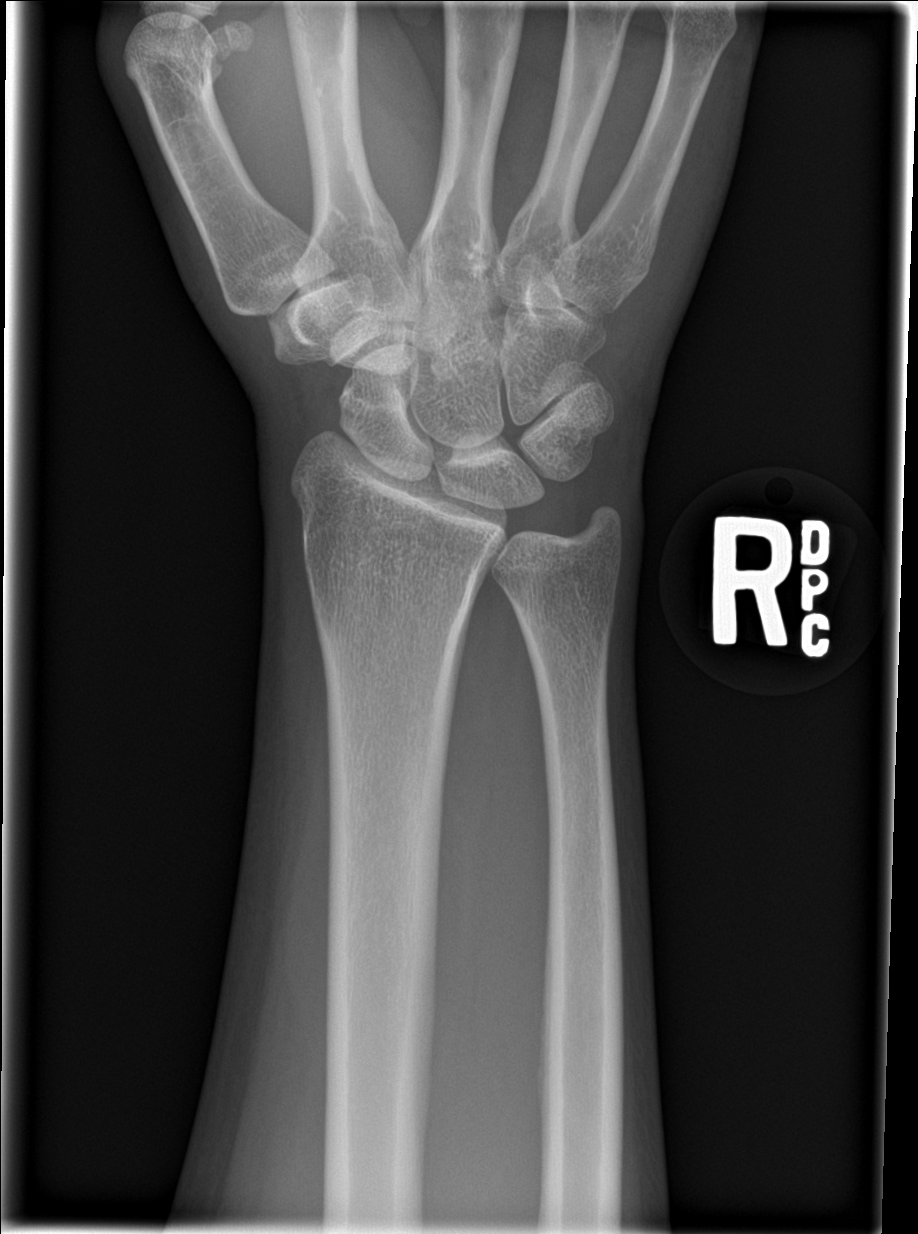

[wrist obl]
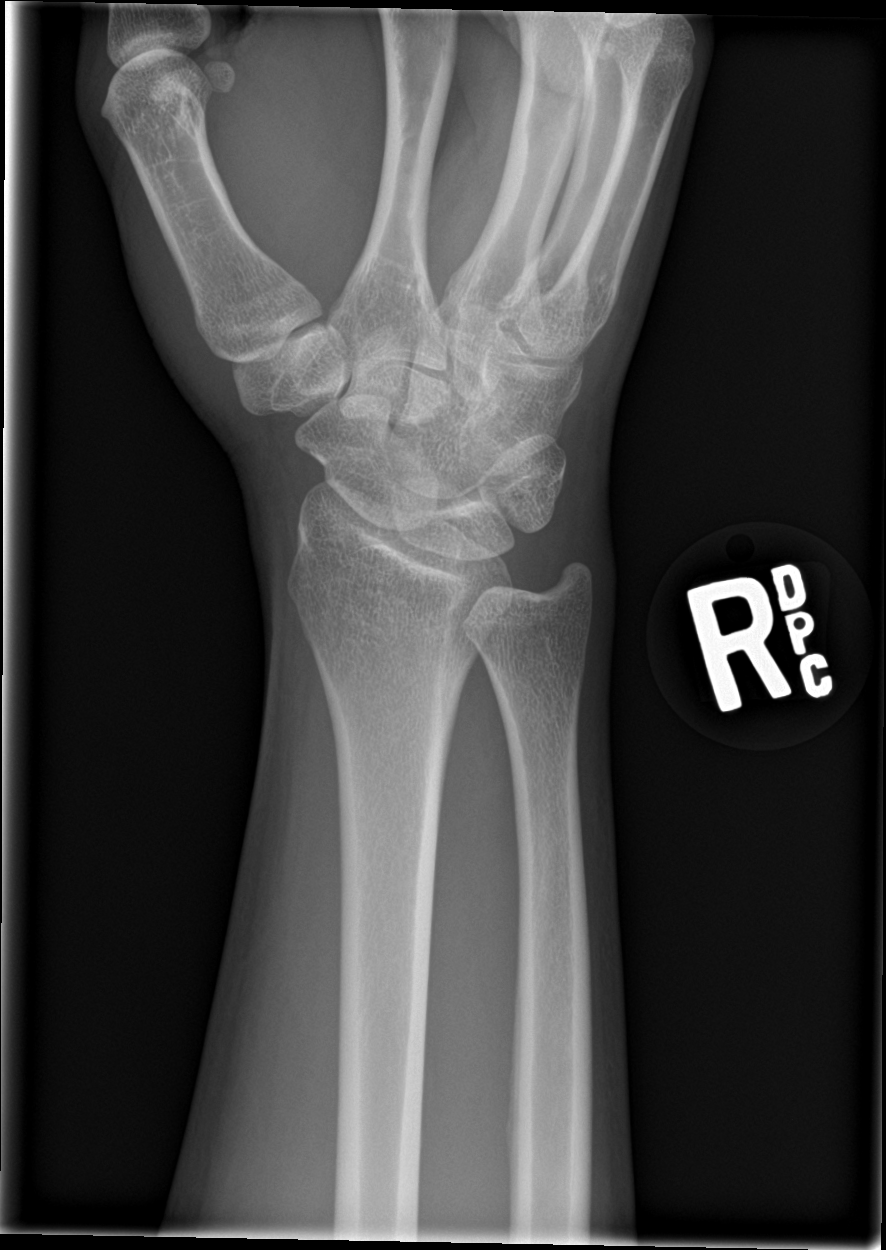

[wrist lat]
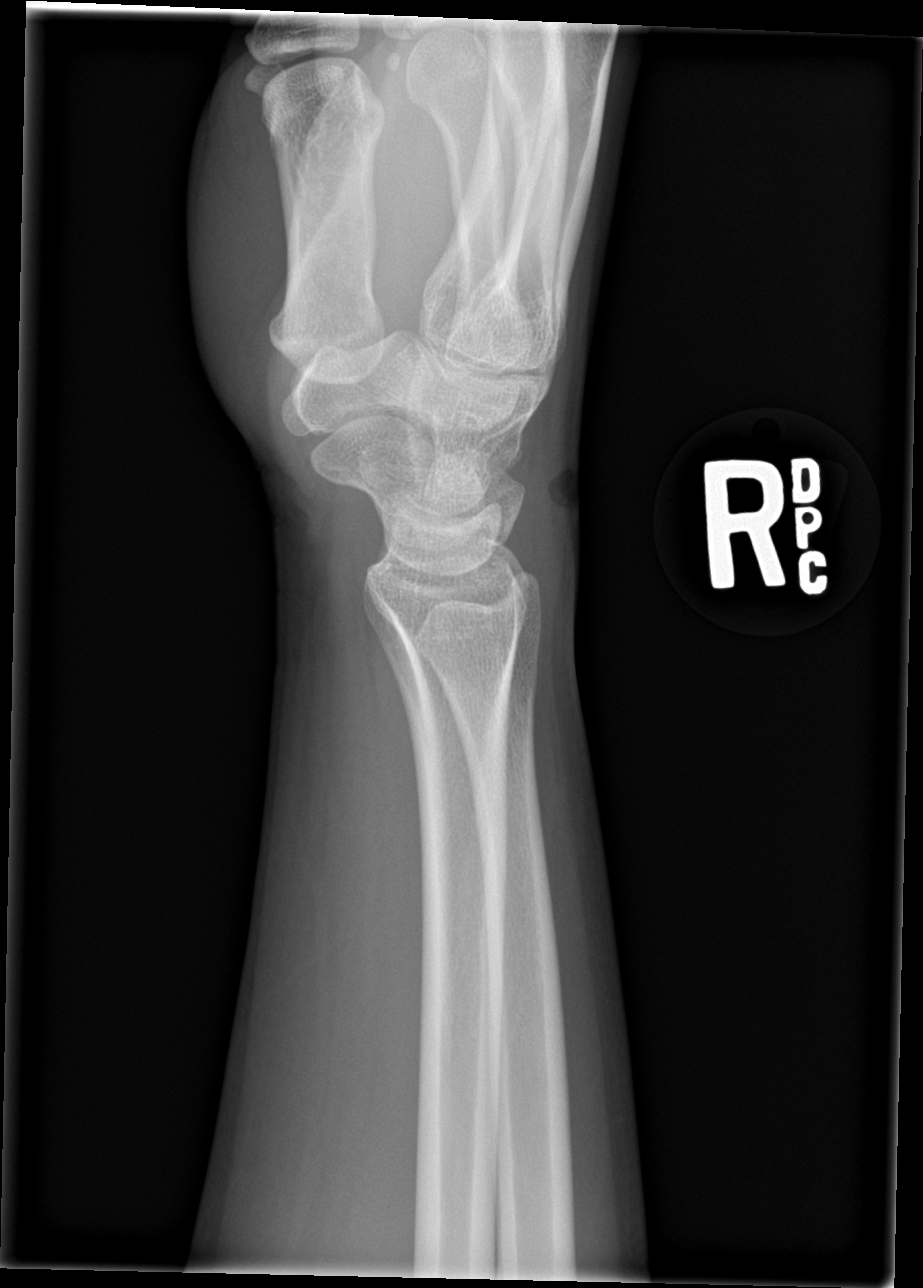

[wrist navicular]
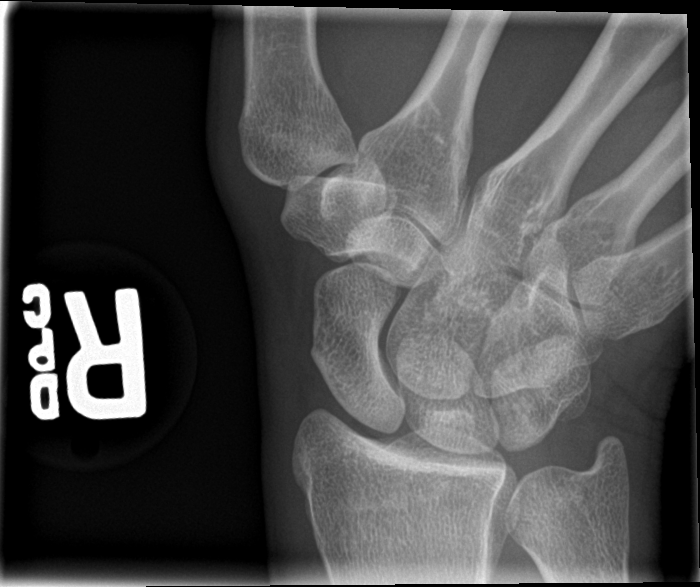

[4 of 4 positions shown; findings below may reference images not displayed]

FINDINGS: Posterior soft tissue laceration over the right wrist. No radiopaque
foreign bodies. No acute bony abnormality. Specifically, no
fracture, subluxation, or dislocation.
IMPRESSION: No acute bony abnormality.

## 2020-03-11 ENCOUNTER — Other Ambulatory Visit (HOSPITAL_COMMUNITY)
Admission: RE | Admit: 2020-03-11 | Discharge: 2020-03-11 | Disposition: A | Discharge status: Home / Self Care | Payer: 59 | Source: Ambulatory Visit | Attending: Obstetrics & Gynecology | Admitting: Obstetrics & Gynecology

## 2020-03-11 ENCOUNTER — Ambulatory Visit (INDEPENDENT_AMBULATORY_CARE_PROVIDER_SITE_OTHER): Payer: 59 | Admitting: Women's Health

## 2020-03-11 ENCOUNTER — Encounter: Payer: Self-pay | Admitting: Women's Health

## 2020-03-11 ENCOUNTER — Other Ambulatory Visit: Payer: Self-pay

## 2020-03-11 VITALS — BP 107/70 | HR 55 | Ht 63.0 in | Wt 161.5 lb

## 2020-03-11 DIAGNOSIS — Z8742 Personal history of other diseases of the female genital tract: Secondary | ICD-10-CM

## 2020-03-11 DIAGNOSIS — Z01419 Encounter for gynecological examination (general) (routine) without abnormal findings: Secondary | ICD-10-CM | POA: Diagnosis not present

## 2020-03-11 NOTE — Progress Notes (Signed)
WELL-WOMAN EXAMINATION Patient name: Miranda Potts MRN [id number]  Date of birth: [birth year] Chief Complaint:   Gynecologic Exam  History of Present Illness:   Miranda Potts is a 30 y.o. G23P1001 African American female being seen today for a routine well-woman exam.  Current complaints: may have yeast infection, some itching  Depression screen [facility] 2/9 03/11/2020 03/28/2019 02/22/2019 04/09/2016  Decreased Interest 0 0 0 0  Down, Depressed, Hopeless 0 0 0 0  PHQ - 2 Score 0 0 0 0  Altered sleeping 0 0 - -  Tired, decreased energy 0 1 - -  Change in appetite 0 0 - -  Feeling bad or failure about yourself  0 0 - -  Trouble concentrating 0 0 - -  Moving slowly or fidgety/restless 0 0 - -  Suicidal thoughts 0 0 - -  PHQ-9 Score 0 1 - -  Difficult doing work/chores - Not difficult at all - -     PCP: Karilyn Cota      does not desire labs Patient's last menstrual period was 03/02/2020. The current method of family planning is condoms.  Last pap 11/01/18. Results were: abnormal ASCUS w/ +HRHPV. H/O abnormal pap: yes Last mammogram: never. Results were: N/A. Family h/o breast cancer: no Last colonoscopy: never. Results were: N/A. Family h/o colorectal cancer: no Review of Systems:   Pertinent items are noted in HPI Denies any headaches, blurred vision, fatigue, shortness of breath, chest pain, abdominal pain, abnormal vaginal discharge/itching/odor/irritation, problems with periods, bowel movements, urination, or intercourse unless otherwise stated above. Pertinent History Reviewed:  Reviewed past medical,surgical, social and family history.  Reviewed problem list, medications and allergies. Physical Assessment:   Vitals:   03/11/20 1144  BP: 107/70  Pulse: (!) 55  Weight: 161 lb 8 oz (73.3 kg)  Height: 5\' 3"  (1.6 m)  Body mass index is 28.61 kg/m.        Physical Examination:   General appearance - well appearing, and in no distress  Mental status - alert, oriented to  person, place, and time  Psych:  She has a normal mood and affect  Skin - warm and dry, normal color, no suspicious lesions noted  Chest - effort normal, all lung fields clear to auscultation bilaterally  Heart - normal rate and regular rhythm  Neck:  midline trachea, no thyromegaly or nodules  Breasts - breasts appear normal, no suspicious masses, no skin or nipple changes or  axillary nodes  Abdomen - soft, nontender, nondistended, no masses or organomegaly  Pelvic - VULVA: normal appearing vulva with no masses, tenderness or lesions  VAGINA: normal appearing vagina with normal color and discharge, no lesions  CERVIX: normal appearing cervix without discharge or lesions, no CMT  Thin prep pap is done w/ HR HPV cotesting  UTERUS: uterus is felt to be normal size, shape, consistency and nontender   ADNEXA: No adnexal masses or tenderness noted.  Extremities:  No swelling or varicosities noted  Chaperone:    No results found for this or any previous visit (from the past 24 hour(s)).  Assessment & Plan:  1) Well-Woman Exam  2) H/O abnormal pap> ASCUS w/ +HRHPV, repeat pap today  Labs/procedures today: pap  Mammogram @28yo  or sooner if problems Colonoscopy @28yo  or sooner if problems  No orders of the defined types were placed in this encounter.   Meds: No orders of the defined types were placed in this encounter.   Follow-up: Return in about 1 year (  around 03/11/2021) for Physical.  Cheral Marker CNM, [facility] 03/11/2020 12:18 PM

## 2020-03-15 LAB — CYTOLOGY - PAP
Chlamydia: NEGATIVE
Comment: NEGATIVE
Comment: NEGATIVE
Comment: NORMAL
Diagnosis: NEGATIVE
High risk HPV: NEGATIVE
Neisseria Gonorrhea: NEGATIVE

## 2020-03-19 ENCOUNTER — Other Ambulatory Visit: Payer: Self-pay

## 2020-03-19 ENCOUNTER — Encounter: Payer: Self-pay | Admitting: Emergency Medicine

## 2020-03-19 ENCOUNTER — Ambulatory Visit
Admission: EM | Admit: 2020-03-19 | Discharge: 2020-03-19 | Disposition: A | Discharge status: Home / Self Care | Payer: 59 | Attending: Family Medicine | Admitting: Family Medicine

## 2020-03-19 ENCOUNTER — Telehealth: Payer: Self-pay

## 2020-03-19 DIAGNOSIS — R519 Headache, unspecified: Secondary | ICD-10-CM | POA: Insufficient documentation

## 2020-03-19 DIAGNOSIS — J029 Acute pharyngitis, unspecified: Secondary | ICD-10-CM | POA: Insufficient documentation

## 2020-03-19 DIAGNOSIS — R52 Pain, unspecified: Secondary | ICD-10-CM | POA: Diagnosis not present

## 2020-03-19 DIAGNOSIS — B349 Viral infection, unspecified: Secondary | ICD-10-CM | POA: Diagnosis not present

## 2020-03-19 LAB — POCT RAPID STREP A (OFFICE): Rapid Strep A Screen: NEGATIVE

## 2020-03-19 NOTE — Discharge Instructions (Signed)
Your rapid strep test is negative.  A throat culture is pending; we will call you if it is positive requiring treatment.    Your COVID test is pending.  You should self quarantine until the test result is back.    Take Tylenol or ibuprofen as needed for fever or discomfort.  Rest and keep yourself hydrated.    Follow-up with your primary care provider if your symptoms are not improving.     

## 2020-03-19 NOTE — ED Triage Notes (Signed)
Sore throat, headache, and body aches since since yesterday.

## 2020-03-19 NOTE — Telephone Encounter (Signed)
Spoke with patient. She states that she has a white discharge, not irritating or with odor. Advised likely just a normal discharge and if it changed to let us know.

## 2020-03-19 NOTE — ED Provider Notes (Signed)
[facility]   [id number] 03/19/20 Arrival Time: 1712   CC: COVID symptoms  SUBJECTIVE: History from: patient.  [place] Mayor is a 30 y.o. female who presents with sore throat, headache, body aches since yesterday. Denies sick exposure to COVID, flu or strep. Denies recent travel. Has negative history of Covid. Has not completed Covid vaccines. Has not taken OTC medications for this. There are no aggravating or alleviating factors. Denies previous symptoms in the past. Denies fever, chills, sinus pain, rhinorrhea, SOB, wheezing, chest pain, nausea, changes in bowel or bladder habits.    ROS: As per HPI.  All other pertinent ROS negative.     Past Medical History:  Diagnosis Date  . Chlamydia   . Eczema   . Superficial acne vulgaris    Past Surgical History:  Procedure Laterality Date  . CESAREAN SECTION N/A 05/13/2019   Procedure: CESAREAN SECTION;  Surgeon: Tilda Burrow, MD;  Location: [facility] LD ORS;  Service: Obstetrics;  Laterality: N/A;  . NO PAST SURGERIES    . none     No Known Allergies No current facility-administered medications on file prior to encounter.   Current Outpatient Medications on File Prior to Encounter  Medication Sig Dispense Refill  . Blood Pressure Monitor MISC For regular home bp monitoring during pregnancy 1 each 0  . cetirizine (ZYRTEC) 10 MG tablet Take 10 mg by mouth as needed.     . hydrocortisone valerate ointment (WESTCORT) 0.2 % Apply 1 application topically 2 (two) times daily. (Patient taking differently: Apply 1 application topically as needed.) 45 g 0  . ibuprofen (ADVIL) 800 MG tablet Take 1 tablet (800 mg total) by mouth every 6 (six) hours. 60 tablet 2  . triamcinolone cream (KENALOG) 0.1 % Apply 1 application topically 2 (two) times daily as needed (For eczema.). 453.6 g 1   Social History   Socioeconomic History  . Marital status: Single    Spouse name: Jacquelynn Cree  . Number of children: Not on file  . Years of  education: Not on file  . Highest education level: Not on file  Occupational History  . Not on file  Tobacco Use  . Smoking status: Never Smoker  . Smokeless tobacco: Never Used  Vaping Use  . Vaping Use: Never used  Substance and Sexual Activity  . Alcohol use: Not Currently    Comment: socially only   . Drug use: No  . Sexual activity: Yes    Partners: Male    Birth control/protection: Condom  Other Topics Concern  . Not on file  Social History Narrative   Single,lives with newborn.Works for [employer] ,Health visitor.   Social Determinants of Health   Financial Resource Strain: Low Risk   . Difficulty of Paying Living Expenses: Not hard at all  Food Insecurity: No Food Insecurity  . Worried About Programme researcher, broadcasting/film/video in the Last Year: Never true  . Ran Out of Food in the Last Year: Never true  Transportation Needs: No Transportation Needs  . Lack of Transportation (Medical): No  . Lack of Transportation (Non-Medical): No  Physical Activity: Insufficiently Active  . Days of Exercise per Week: 2 days  . Minutes of Exercise per Session: 30 min  Stress: No Stress Concern Present  . Feeling of Stress : Not at all  Social Connections: Socially Integrated  . Frequency of Communication with Friends and Family: More than three times a week  . Frequency of Social Gatherings with Friends and Family: More  than three times a week  . Attends Religious Services: More than 4 times per year  . Active Member of Clubs or Organizations: No  . Attends Banker Meetings: 1 to 4 times per year  . Marital Status: Living with partner  Intimate Partner Violence: Not At Risk  . Fear of Current or Ex-Partner: No  . Emotionally Abused: No  . Physically Abused: No  . Sexually Abused: No   Family History  Problem Relation Age of Onset  . Healthy Mother   . Healthy Father   . Hypertension Maternal Grandmother     OBJECTIVE:  Vitals:   03/19/20 1812  BP: 130/80  Pulse: 93   Resp: 18  Temp: 98.3 F (36.8 C)  TempSrc: Oral  SpO2: 99%  Weight: 161 lb (73 kg)  Height: 5\' 3"  (1.6 m)     General appearance: alert; appears fatigued, but nontoxic; speaking in full sentences and tolerating own secretions HEENT: NCAT; Ears: EACs clear, TMs pearly gray; Eyes: PERRL.  EOM grossly intact. Sinuses: nontender; Nose: nares patent with clear rhinorrhea, Throat: oropharynx erythematous, cobblestoning present, tonsils non erythematous or enlarged, uvula midline  Neck: supple without LAD Lungs: unlabored respirations, symmetrical air entry; cough: absent; no respiratory distress; CTAB Heart: regular rate and rhythm.  Radial pulses 2+ symmetrical bilaterally Skin: warm and dry Psychological: alert and cooperative; normal mood and affect  LABS:  No results found for this or any previous visit (from the past 24 hour(s)).   ASSESSMENT & PLAN:  1. Viral illness   2. Nonintractable headache, unspecified chronicity pattern, unspecified headache type   3. Sore throat   4. Body aches     Your rapid strep test is negative.  A throat culture is pending; we will call you if it is positive requiring treatment.   Continue supportive care at home COVID and flu testing ordered.  It will take between 2-3 days for test results. Someone will contact you regarding abnormal results.  Work note provided Patient should remain in quarantine until they have received Covid results.  If negative you may resume normal activities (go back to work/school) while practicing hand hygiene, social distance, and mask wearing.  If positive, patient should remain in quarantine for at least 5 days from symptom onset AND greater than 72 hours after symptoms resolution (absence of fever without the use of fever-reducing medication and improvement in respiratory symptoms), whichever is longer Get plenty of rest and push fluids Use OTC zyrtec for nasal congestion, runny nose, and/or sore throat Use OTC flonase  for nasal congestion and runny nose Use medications daily for symptom relief Use OTC medications like ibuprofen or tylenol as needed fever or pain Call or go to the ED if you have any new or worsening symptoms such as fever, worsening cough, shortness of breath, chest tightness, chest pain, turning blue, changes in mental status.  Reviewed expectations re: course of current medical issues. Questions answered. Outlined signs and symptoms indicating need for more acute intervention. Patient verbalized understanding. After Visit Summary given.         , NP 03/23/20 1212

## 2020-03-19 NOTE — Telephone Encounter (Signed)
Patient states pap was normal but she is still having symptoms. Would like a call back.

## 2020-03-21 LAB — COVID-19, FLU A+B NAA
Influenza A, NAA: NOT DETECTED
Influenza B, NAA: NOT DETECTED
SARS-CoV-2, NAA: DETECTED — AB

## 2020-03-22 LAB — CULTURE, GROUP A STREP (THRC)

## 2020-08-02 ENCOUNTER — Other Ambulatory Visit: Payer: Self-pay

## 2020-08-02 ENCOUNTER — Ambulatory Visit
Admission: EM | Admit: 2020-08-02 | Discharge: 2020-08-02 | Disposition: A | Discharge status: Home / Self Care | Payer: 59 | Attending: Emergency Medicine | Admitting: Emergency Medicine

## 2020-08-02 DIAGNOSIS — H60392 Other infective otitis externa, left ear: Secondary | ICD-10-CM

## 2020-08-02 MED ORDER — NEOMYCIN-POLYMYXIN-HC 3.5-10000-1 OT SUSP: 4.0000 [drp] | Freq: Four times a day (QID) | OTIC | 0 refills | Status: AC

## 2020-08-02 MED ORDER — IBUPROFEN 600 MG PO TABS: 600.0000 mg | ORAL_TABLET | Freq: Four times a day (QID) | ORAL | 0 refills | Status: AC | PRN

## 2020-08-02 MED ORDER — CIPRO HC 0.2-1 % OT SUSP: 3.0000 [drp] | Freq: Two times a day (BID) | OTIC | 0 refills | Status: DC

## 2020-08-02 NOTE — ED Provider Notes (Signed)
HPI  SUBJECTIVE:  Miranda Potts is a 30 y.o. female who presents with 1 week of throbbing left ear pain and waxy otorrhea.  She recently washed her hair and wears earphones of her ears for prolonged periods of time at work.  No change in hearing, foreign body insertion, recent swimming, URI symptoms, fevers.  No antibiotics in the past month.  No antipyretic in the past 6 hours.  No aggravating or alleviating factors.  She has not tried anything for this.  Past medical history negative for diabetes.  LMP: 5/26.  Denies possibility being pregnant.  PMD: Dr. Karilyn Cota.  Past Medical History:  Diagnosis Date  . Chlamydia   . Eczema   . Superficial acne vulgaris     Past Surgical History:  Procedure Laterality Date  . CESAREAN SECTION N/A 05/13/2019   Procedure: CESAREAN SECTION;  Surgeon: Tilda Burrow, MD;  Location: [facility];  Service: Obstetrics;  Laterality: N/A;  . NO PAST SURGERIES    . none      Family History  Problem Relation Age of Onset  . Healthy Mother   . Healthy Father   . Hypertension Maternal Grandmother     Social History   Tobacco Use  . Smoking status: Never Smoker  . Smokeless tobacco: Never Used  Vaping Use  . Vaping Use: Never used  Substance Use Topics  . Alcohol use: Not Currently    Comment: socially only   . Drug use: No    No current facility-administered medications for this encounter.  Current Outpatient Medications:  .  ciprofloxacin-hydrocortisone (CIPRO HC) OTIC suspension, Place 3 drops into the left ear 2 (two) times daily. X 7 days, Disp: 10 mL, Rfl: 0 .  ibuprofen (ADVIL) 600 MG tablet, Take 1 tablet (600 mg total) by mouth every 6 (six) hours as needed., Disp: 30 tablet, Rfl: 0 .  neomycin-polymyxin-hydrocortisone (CORTISPORIN) 3.5-10000-1 OTIC suspension, Place 4 drops into the left ear 4 (four) times daily for 7 days., Disp: 10 mL, Rfl: 0 .  Blood Pressure Monitor MISC, For regular home bp monitoring during pregnancy, Disp: 1  each, Rfl: 0 .  triamcinolone cream (KENALOG) 0.1 %, Apply 1 application topically 2 (two) times daily as needed (For eczema.)., Disp: 453.6 g, Rfl: 1  No Known Allergies   ROS  As noted in HPI.   Physical Exam  BP 127/83 (BP Location: Right Arm)   Pulse 82   Temp 98.3 F (36.8 C) (Oral)   Resp 16   LMP 07/25/2020   SpO2 98%   Constitutional: Well developed, well nourished, no acute distress Eyes:  EOMI, conjunctiva normal bilaterally HENT: Normocephalic, atraumatic,mucus membranes moist.  Left external ear normal.  No pain with traction on pinna.  Positive pain with palpation of tragus.  No tenderness with palpation of mastoid.  Positive debris in EAC.  Unable to visualize TM.  No tenderness over the TMJ.  Right TM normal. Neck: Positive left-sided cervical lymphadenopathy  respiratory: Normal inspiratory effort Cardiovascular: Normal rate GI: nondistended skin: No rash, skin intact Musculoskeletal: no deformities Neurologic: Alert & oriented x 3, no focal neuro deficits Psychiatric: Speech and behavior appropriate   ED Course   Medications - No data to display  No orders of the defined types were placed in this encounter.   No results found for this or any previous visit (from the past 24 hour(s)). No results found.  ED Clinical Impression  1. Infective otitis externa of left ear  ED Assessment/Plan  Difficult to tell if this is an otitis media or externa.  Will have ear irrigated and reevaluate.  Does not appear to be TMJ arthralgia.  Post irrigation, erythematous external ear canal.  Still with some debris.  TM intact, normal.  Will treat as an otitis externa due to pain with palpation of tragus and cervical lymphadenopathy.  Home with Cipro HC, and if this is too expensive, then Cortisporin.  Tylenol/ibuprofen as needed.  Follow-up with PMD as needed.  Discussed  MDM, treatment plan, and plan for follow-up with patient.  patient agrees with plan.    Meds ordered this encounter  Medications  . neomycin-polymyxin-hydrocortisone (CORTISPORIN) 3.5-10000-1 OTIC suspension    Sig: Place 4 drops into the left ear 4 (four) times daily for 7 days.    Dispense:  10 mL    Refill:  0  . ibuprofen (ADVIL) 600 MG tablet    Sig: Take 1 tablet (600 mg total) by mouth every 6 (six) hours as needed.    Dispense:  30 tablet    Refill:  0  . ciprofloxacin-hydrocortisone (CIPRO HC) OTIC suspension    Sig: Place 3 drops into the left ear 2 (two) times daily. X 7 days    Dispense:  10 mL    Refill:  0      *This clinic note was created using Scientist, clinical (histocompatibility and immunogenetics). Therefore, there may be occasional mistakes despite careful proofreading.  ?    Domenick Gong, MD 08/03/20 [phone]

## 2020-08-02 NOTE — Discharge Instructions (Addendum)
I am giving you 2 medicines: The Cipro HC is less frequent dosing.  You only need to do it twice a day.  However if it is too expensive, then get the Cortisporin instead.  You have to do this 4 times a day.  You may take the ibuprofen with 1000 mg of Tylenol 3-4 times a day as needed for pain.

## 2020-08-02 NOTE — ED Triage Notes (Signed)
Left ear pain with drainage x 1 week.

## 2021-06-17 ENCOUNTER — Telehealth: Payer: Self-pay | Admitting: Women's Health

## 2021-06-17 NOTE — Telephone Encounter (Signed)
Patient would like to have orders for her annual blood work also., She has an appt on the 28th for her pap/physical. She also was asking what all blood work would be done. Please advise.  ?

## 2021-06-27 ENCOUNTER — Other Ambulatory Visit (HOSPITAL_COMMUNITY)
Admission: RE | Admit: 2021-06-27 | Discharge: 2021-06-27 | Disposition: A | Discharge status: Home / Self Care | Payer: 59 | Source: Ambulatory Visit | Attending: Adult Health | Admitting: Adult Health

## 2021-06-27 ENCOUNTER — Encounter: Payer: Self-pay | Admitting: Adult Health

## 2021-06-27 ENCOUNTER — Ambulatory Visit (INDEPENDENT_AMBULATORY_CARE_PROVIDER_SITE_OTHER): Payer: 59 | Admitting: Adult Health

## 2021-06-27 VITALS — BP 113/74 | HR 74 | Ht 64.0 in | Wt 153.5 lb

## 2021-06-27 DIAGNOSIS — Z131 Encounter for screening for diabetes mellitus: Secondary | ICD-10-CM

## 2021-06-27 DIAGNOSIS — Z1329 Encounter for screening for other suspected endocrine disorder: Secondary | ICD-10-CM | POA: Insufficient documentation

## 2021-06-27 DIAGNOSIS — Z01419 Encounter for gynecological examination (general) (routine) without abnormal findings: Secondary | ICD-10-CM

## 2021-06-27 DIAGNOSIS — Z8632 Personal history of gestational diabetes: Secondary | ICD-10-CM | POA: Diagnosis not present

## 2021-06-27 DIAGNOSIS — Z113 Encounter for screening for infections with a predominantly sexual mode of transmission: Secondary | ICD-10-CM

## 2021-06-27 DIAGNOSIS — Z13220 Encounter for screening for lipoid disorders: Secondary | ICD-10-CM

## 2021-06-27 NOTE — Progress Notes (Signed)
Patient ID: Miranda Potts, female   DOB: [birth year], 31 y.o.   MRN: [id number] ?History of Present Illness: ?Miranda is a 31 year old black female,single, G1P1 in for a well woman gyn exam. ? ?Lab Results  ?Component Value Date  ? DIAGPAP  03/11/2020  ?  - Negative for intraepithelial lesion or malignancy (NILM)  ? HPVHIGH Negative 03/11/2020  ?  ?Current Medications, Allergies, Past Medical History, Past Surgical History, Family History and Social History were reviewed in [employer] record.   ? ? ?Review of Systems: ? ?Patient denies any headaches, hearing loss, fatigue, blurred vision, shortness of breath, chest pain, abdominal pain, problems with bowel movements, urination, or intercourse. No joint pain or mood swings.  ? ? ?Physical Exam:BP 113/74 (BP Location: Left Arm, Patient Position: Sitting, Cuff Size: Normal)   Pulse 74   Ht 5\' 4"  (1.626 m)   Wt 153 lb 8 oz (69.6 kg)   LMP 06/01/2021 (Approximate)   BMI 26.35 kg/m?   ?General:  Well developed, well nourished, no acute distress ?Skin:  Warm and dry ?Neck:  Midline trachea, normal thyroid, good ROM, no lymphadenopathy ?Lungs; Clear to auscultation bilaterally ?Breast:  No dominant palpable mass, retraction, or nipple discharge ?Cardiovascular: Regular rate and rhythm ?Abdomen:  Soft, non tender, no hepatosplenomegaly ?Pelvic:  External genitalia is normal in appearance, no lesions.  The vagina is normal in appearance. Urethra has no lesions or masses. The cervix is smooth, CV swab obtained.  Uterus is felt to be normal size, shape, and contour.  No adnexal masses or tenderness noted.Bladder is non tender, no masses felt. ?Extremities/musculoskeletal:  No swelling or varicosities noted, no clubbing or cyanosis ?Psych:  No mood changes, alert and cooperative,seems happy ?AA is 0 ?Fall risk is low ? ?  06/27/2021  ? 11:33 AM 03/11/2020  ? 11:40 AM 03/28/2019  ? 11:13 AM  ?Depression screen PHQ 2/9  ?Decreased Interest 0 0 0  ?Down,  Depressed, Hopeless 0 0 0  ?PHQ - 2 Score 0 0 0  ?Altered sleeping 0 0 0  ?Tired, decreased energy 0 0 1  ?Change in appetite 0 0 0  ?Feeling bad or failure about yourself  0 0 0  ?Trouble concentrating 0 0 0  ?Moving slowly or fidgety/restless 0 0 0  ?Suicidal thoughts 0 0 0  ?PHQ-9 Score 0 0 1  ?Difficult doing work/chores   Not difficult at all  ?  ? ?  06/27/2021  ? 11:34 AM 03/11/2020  ? 11:40 AM  ?GAD 7 : Generalized Anxiety Score  ?Nervous, Anxious, on Edge 0 0  ?Control/stop worrying 0 0  ?Worry too much - different things 0 0  ?Trouble relaxing 0 0  ?Restless 0 0  ?Easily annoyed or irritable 0 0  ?Afraid - awful might happen 0 0  ?Total GAD 7 Score 0 0  ? ? [place] - 06/27/21 1132   ? ?  ? Pregnancy Intention Screening  ? Does the patient want to become pregnant in the next year? No   ? Does the patient's partner want to become pregnant in the next year? No   ? Would the patient like to discuss contraceptive options today? No   ?  ? Contraception Wrap Up  ? Current Method Female Condom   ? End Method Female Condom   ? ?  ?  ? ?  ?  ? Examination chaperoned by 06/29/21 LPN ? ? ?Impression and Plan: ?1. Encounter for well  woman exam with routine gynecological exam ?Physical in 1 year ?Pap in 2025 ?Will check labs a her request ?- CBC ?- Comprehensive metabolic panel ?- TSH ?- Lipid panel ? ?2. Screening examination for STD (sexually transmitted disease) ?CV swab sent for GC/CHL, trich  ?- Cervicovaginal ancillary only( [place]) ? ?3. History of gestational diabetes ?- Hemoglobin A1c ? ?4. Screening for thyroid disorder ?- TSH ? ?5. Screening cholesterol level ?- Lipid panel ? ?6. Screening for diabetes mellitus ?- Hemoglobin A1c  ? ? ? ?  ?  ?

## 2021-06-28 LAB — CBC
Hematocrit: 37.8 % (ref 34.0–46.6)
Hemoglobin: 12.4 g/dL (ref 11.1–15.9)
MCH: 25.9 pg — ABNORMAL LOW (ref 26.6–33.0)
MCHC: 32.8 g/dL (ref 31.5–35.7)
MCV: 79 fL (ref 79–97)
Platelets: 290 10*3/uL (ref 150–450)
RBC: 4.78 x10E6/uL (ref 3.77–5.28)
RDW: 14.4 % (ref 11.7–15.4)
WBC: 5.3 10*3/uL (ref 3.4–10.8)

## 2021-06-28 LAB — LIPID PANEL
Chol/HDL Ratio: 3.7 ratio (ref 0.0–4.4)
Cholesterol, Total: 150 mg/dL (ref 100–199)
HDL: 41 mg/dL (ref >39)
LDL Chol Calc (NIH): 91 mg/dL (ref 0–99)
Triglycerides: 93 mg/dL (ref 0–149)
VLDL Cholesterol Cal: 18 mg/dL (ref 5–40)

## 2021-06-28 LAB — COMPREHENSIVE METABOLIC PANEL
ALT: 11 IU/L (ref 0–32)
AST: 14 IU/L (ref 0–40)
Albumin/Globulin Ratio: 1.9 (ref 1.2–2.2)
Albumin: 4.7 g/dL (ref 3.9–5.0)
Alkaline Phosphatase: 59 IU/L (ref 44–121)
BUN/Creatinine Ratio: 19 (ref 9–23)
BUN: 14 mg/dL (ref 6–20)
Bilirubin Total: 0.9 mg/dL (ref 0.0–1.2)
CO2: 19 mmol/L — ABNORMAL LOW (ref 20–29)
Calcium: 9.3 mg/dL (ref 8.7–10.2)
Chloride: 103 mmol/L (ref 96–106)
Creatinine, Ser: 0.73 mg/dL (ref 0.57–1.00)
Globulin, Total: 2.5 g/dL (ref 1.5–4.5)
Glucose: 83 mg/dL (ref 70–99)
Potassium: 4.3 mmol/L (ref 3.5–5.2)
Sodium: 138 mmol/L (ref 134–144)
Total Protein: 7.2 g/dL (ref 6.0–8.5)
eGFR: 114 mL/min/{1.73_m2} (ref >59)

## 2021-06-28 LAB — HEMOGLOBIN A1C
Est. average glucose Bld gHb Est-mCnc: 114 mg/dL
Hgb A1c MFr Bld: 5.6 % (ref 4.8–5.6)

## 2021-06-28 LAB — TSH: TSH: 0.993 u[IU]/mL (ref 0.450–4.500)

## 2021-07-01 LAB — CERVICOVAGINAL ANCILLARY ONLY
Chlamydia: NEGATIVE
Comment: NEGATIVE
Comment: NEGATIVE
Comment: NORMAL
Neisseria Gonorrhea: NEGATIVE
Trichomonas: NEGATIVE

## 2021-08-05 ENCOUNTER — Other Ambulatory Visit (INDEPENDENT_AMBULATORY_CARE_PROVIDER_SITE_OTHER): Payer: 59 | Admitting: *Deleted

## 2021-08-05 ENCOUNTER — Other Ambulatory Visit (HOSPITAL_COMMUNITY)
Admission: RE | Admit: 2021-08-05 | Discharge: 2021-08-05 | Disposition: A | Discharge status: Home / Self Care | Payer: 59 | Source: Ambulatory Visit | Attending: Obstetrics & Gynecology | Admitting: Obstetrics & Gynecology

## 2021-08-05 DIAGNOSIS — N898 Other specified noninflammatory disorders of vagina: Secondary | ICD-10-CM | POA: Insufficient documentation

## 2021-08-05 DIAGNOSIS — R8290 Unspecified abnormal findings in urine: Secondary | ICD-10-CM

## 2021-08-05 LAB — POCT URINALYSIS DIPSTICK OB
Blood, UA: NEGATIVE
Glucose, UA: NEGATIVE
Leukocytes, UA: NEGATIVE
Nitrite, UA: NEGATIVE

## 2021-08-05 NOTE — Progress Notes (Signed)
   NURSE VISIT- VAGINITIS/STD/POC  SUBJECTIVE:  Niger Goble is a 31 y.o. G1P1001 GYN patientfemale here for a vaginal swab for vaginitis screening, STD screen.  She reports the following symptoms: discharge described as white for 1 week. Denies abnormal vaginal bleeding, significant pelvic pain, fever, or UTI symptoms.  OBJECTIVE:  There were no vitals taken for this visit.  Appears well, in no apparent distress  ASSESSMENT: Vaginal swab for STD screen  Patient also reports foul smelling urine. Urine sent for culture.   PLAN: Self-collected vaginal probe for Gonorrhea, Chlamydia, Trichomonas, Bacterial Vaginosis, Yeast sent to lab Treatment: to be determined once results are received Follow-up as needed if symptoms persist/worsen, or new symptoms develop  Janece Canterbury  08/05/2021 4:26 PM

## 2021-08-08 ENCOUNTER — Telehealth: Payer: Self-pay | Admitting: *Deleted

## 2021-08-08 ENCOUNTER — Other Ambulatory Visit: Payer: Self-pay | Admitting: Obstetrics & Gynecology

## 2021-08-08 DIAGNOSIS — N3000 Acute cystitis without hematuria: Secondary | ICD-10-CM

## 2021-08-08 LAB — URINE CULTURE

## 2021-08-08 MED ORDER — NITROFURANTOIN MONOHYD MACRO 100 MG PO CAPS: 100.0000 mg | ORAL_CAPSULE | Freq: Two times a day (BID) | ORAL | 0 refills | Status: AC

## 2021-08-08 NOTE — Telephone Encounter (Signed)
-----   Message from Myna Hidalgo, DO sent at 08/08/2021 12:32 PM EDT ----- +UTI, Rx for [facility] sent in

## 2021-08-08 NOTE — Progress Notes (Signed)
Rx sent in for UTI.

## 2021-08-08 NOTE — Telephone Encounter (Signed)
Pt aware + for UTI. Med was sent to pharmacy. Swab still pending. Pt voiced understanding. [place]

## 2021-08-11 LAB — CERVICOVAGINAL ANCILLARY ONLY
Bacterial Vaginitis (gardnerella): POSITIVE — AB
Candida Glabrata: NEGATIVE
Candida Vaginitis: NEGATIVE
Chlamydia: NEGATIVE
Comment: NEGATIVE
Comment: NEGATIVE
Comment: NEGATIVE
Comment: NEGATIVE
Comment: NEGATIVE
Comment: NORMAL
Neisseria Gonorrhea: NEGATIVE
Trichomonas: NEGATIVE

## 2021-08-12 ENCOUNTER — Telehealth: Payer: Self-pay | Admitting: *Deleted

## 2021-08-12 ENCOUNTER — Other Ambulatory Visit: Payer: Self-pay | Admitting: Obstetrics & Gynecology

## 2021-08-12 DIAGNOSIS — B9689 Other specified bacterial agents as the cause of diseases classified elsewhere: Secondary | ICD-10-CM

## 2021-08-12 MED ORDER — METRONIDAZOLE 500 MG PO TABS: 500.0000 mg | ORAL_TABLET | Freq: Two times a day (BID) | ORAL | 0 refills | Status: DC

## 2021-08-12 NOTE — Progress Notes (Signed)
Rx for flagyl

## 2021-08-12 NOTE — Telephone Encounter (Signed)
-----   Message from Myna Hidalgo, DO sent at 08/12/2021 11:41 AM EDT ----- Rx for BV sent in

## 2021-08-12 NOTE — Telephone Encounter (Signed)
Pt aware swab + for BV. Med was sent to pharmacy. Advised no sex or alcohol while on med. Pt voiced understanding. JSY

## 2022-02-26 ENCOUNTER — Other Ambulatory Visit: Payer: 59

## 2022-02-27 ENCOUNTER — Other Ambulatory Visit (HOSPITAL_COMMUNITY)
Admission: RE | Admit: 2022-02-27 | Discharge: 2022-02-27 | Disposition: A | Discharge status: Home / Self Care | Payer: 59 | Source: Ambulatory Visit | Attending: Obstetrics & Gynecology | Admitting: Obstetrics & Gynecology

## 2022-02-27 ENCOUNTER — Other Ambulatory Visit (INDEPENDENT_AMBULATORY_CARE_PROVIDER_SITE_OTHER): Payer: 59

## 2022-02-27 DIAGNOSIS — N898 Other specified noninflammatory disorders of vagina: Secondary | ICD-10-CM | POA: Diagnosis not present

## 2022-02-27 DIAGNOSIS — R350 Frequency of micturition: Secondary | ICD-10-CM | POA: Diagnosis not present

## 2022-02-27 DIAGNOSIS — R8290 Unspecified abnormal findings in urine: Secondary | ICD-10-CM

## 2022-02-27 LAB — POCT URINALYSIS DIPSTICK OB
Glucose, UA: NEGATIVE
Ketones, UA: NEGATIVE
Leukocytes, UA: NEGATIVE
Nitrite, UA: NEGATIVE

## 2022-02-27 NOTE — Progress Notes (Addendum)
   NURSE VISIT- UTI SYMPTOMS   SUBJECTIVE:  Miranda Potts is a 31 y.o. G89P1001 female here for UTI symptoms. She is a GYN patient. She reports urinary frequency and foul smelling urine .  Also reports a brown discharge for one week   OBJECTIVE:  There were no vitals taken for this visit.  Appears well, in no apparent distress  Results for orders placed or performed in visit on 02/27/22 (from the past 24 hour(s))  POC Urinalysis Dipstick OB   Collection Time: 02/27/22 11:44 AM  Result Value Ref Range   Color, UA     Clarity, UA     Glucose, UA Negative Negative   Bilirubin, UA     Ketones, UA neg    Spec Grav, UA     Blood, UA small    pH, UA     POC,PROTEIN,UA Trace Negative, Trace, Small (1+), Moderate (2+), Large (3+), 4+   Urobilinogen, UA     Nitrite, UA neg    Leukocytes, UA Negative Negative   Appearance     Odor      ASSESSMENT: GYN patient with UTI symptoms and negative nitrites  PLAN: Note routed to Cyril Mourning, AGNP   Rx sent by provider today: No Urine culture sent CV swab sent Call or return to clinic prn if these symptoms worsen or fail to improve as anticipated. Follow-up: as needed   Jobe Marker  02/27/2022 11:45 AM

## 2022-03-02 LAB — URINE CULTURE: Organism ID, Bacteria: NO GROWTH

## 2022-03-03 ENCOUNTER — Other Ambulatory Visit: Payer: Self-pay | Admitting: Adult Health

## 2022-03-03 LAB — CERVICOVAGINAL ANCILLARY ONLY
Bacterial Vaginitis (gardnerella): POSITIVE — AB
Candida Glabrata: NEGATIVE
Candida Vaginitis: NEGATIVE
Chlamydia: NEGATIVE
Comment: NEGATIVE
Comment: NEGATIVE
Comment: NEGATIVE
Comment: NEGATIVE
Comment: NEGATIVE
Comment: NORMAL
Neisseria Gonorrhea: NEGATIVE
Trichomonas: NEGATIVE

## 2022-03-03 MED ORDER — METRONIDAZOLE 500 MG PO TABS: 500.0000 mg | ORAL_TABLET | Freq: Two times a day (BID) | ORAL | 0 refills | Status: DC

## 2022-03-03 NOTE — Progress Notes (Signed)
+  BV on vaginal swab will rx flagyl,no sex or alcohol while taking  ?

## 2022-07-07 ENCOUNTER — Other Ambulatory Visit (INDEPENDENT_AMBULATORY_CARE_PROVIDER_SITE_OTHER): Payer: 59

## 2022-07-07 ENCOUNTER — Other Ambulatory Visit (HOSPITAL_COMMUNITY)
Admission: RE | Admit: 2022-07-07 | Discharge: 2022-07-07 | Disposition: A | Discharge status: Home / Self Care | Payer: 59 | Source: Ambulatory Visit | Attending: Obstetrics & Gynecology | Admitting: Obstetrics & Gynecology

## 2022-07-07 DIAGNOSIS — N898 Other specified noninflammatory disorders of vagina: Secondary | ICD-10-CM | POA: Diagnosis not present

## 2022-07-07 NOTE — Progress Notes (Addendum)
   NURSE VISIT- VAGINITIS  SUBJECTIVE:  Miranda Potts is a 32 y.o. G1P1001 GYN patientfemale here for a vaginal swab for vaginitis screening.  She reports the following symptoms: discharge described as green, local irritation, and odor for 1 week. Denies abnormal vaginal bleeding, significant pelvic pain, fever, or UTI symptoms.  OBJECTIVE:  There were no vitals taken for this visit.  Appears well, in no apparent distress  ASSESSMENT: Vaginal swab for vaginitis screening  PLAN: Self-collected vaginal probe for Chlamydia, gonorrhea, trich, Bacterial Vaginosis, Yeast sent to lab. Treatment: to be determined once results are received Follow-up as needed if symptoms persist/worsen, or new symptoms develop  Jobe Marker  07/07/2022 4:56 PM

## 2022-07-07 NOTE — Addendum Note (Signed)
Addended by: Moss Mc on: 07/07/2022 04:57 PM   Modules accepted: Orders

## 2022-07-09 ENCOUNTER — Other Ambulatory Visit: Payer: Self-pay | Admitting: Adult Health

## 2022-07-09 LAB — CERVICOVAGINAL ANCILLARY ONLY
Bacterial Vaginitis (gardnerella): POSITIVE — AB
Candida Glabrata: NEGATIVE
Candida Vaginitis: NEGATIVE
Chlamydia: NEGATIVE
Comment: NEGATIVE
Comment: NEGATIVE
Comment: NEGATIVE
Comment: NEGATIVE
Comment: NEGATIVE
Comment: NORMAL
Neisseria Gonorrhea: NEGATIVE
Trichomonas: NEGATIVE

## 2022-07-09 MED ORDER — METRONIDAZOLE 500 MG PO TABS: 500.0000 mg | ORAL_TABLET | Freq: Two times a day (BID) | ORAL | 0 refills | Status: DC

## 2022-07-09 NOTE — Progress Notes (Signed)
+  BV on vaginal swab will rx flagyl,no sex or alcohol while taking  ?

## 2022-11-03 ENCOUNTER — Other Ambulatory Visit: Payer: Self-pay | Admitting: Adult Health

## 2022-11-03 MED ORDER — METRONIDAZOLE 500 MG PO TABS: 500.0000 mg | ORAL_TABLET | Freq: Two times a day (BID) | ORAL | 1 refills | Status: DC

## 2022-11-03 NOTE — Progress Notes (Signed)
Refilled flagyl 

## 2023-05-27 ENCOUNTER — Ambulatory Visit: Admitting: Physician Assistant

## 2023-07-06 ENCOUNTER — Ambulatory Visit: Admitting: Physician Assistant

## 2023-07-21 ENCOUNTER — Other Ambulatory Visit: Payer: Self-pay | Admitting: Adult Health

## 2023-09-17 ENCOUNTER — Ambulatory Visit: Admitting: Physician Assistant

## 2023-12-14 ENCOUNTER — Ambulatory Visit: Admitting: Physician Assistant

## 2024-01-28 ENCOUNTER — Ambulatory Visit: Admitting: Physician Assistant

## 2024-02-03 ENCOUNTER — Ambulatory Visit: Admitting: Physician Assistant
